# Patient Record
Sex: Female | Born: 1980
Health system: Southern US, Community
[De-identification: ages and names within clinical notes are randomized; demographics above are authoritative.]

## PROBLEM LIST (undated history)

## (undated) DIAGNOSIS — F32A Depression, unspecified: Secondary | ICD-10-CM

## (undated) DIAGNOSIS — I10 Essential (primary) hypertension: Secondary | ICD-10-CM

## (undated) DIAGNOSIS — F419 Anxiety disorder, unspecified: Secondary | ICD-10-CM

## (undated) DIAGNOSIS — Z114 Encounter for screening for human immunodeficiency virus [HIV]: Secondary | ICD-10-CM

## (undated) DIAGNOSIS — F329 Major depressive disorder, single episode, unspecified: Secondary | ICD-10-CM

## (undated) DIAGNOSIS — G5602 Carpal tunnel syndrome, left upper limb: Secondary | ICD-10-CM

## (undated) DIAGNOSIS — Z9189 Other specified personal risk factors, not elsewhere classified: Secondary | ICD-10-CM

## (undated) DIAGNOSIS — O09299 Supervision of pregnancy with other poor reproductive or obstetric history, unspecified trimester: Secondary | ICD-10-CM

## (undated) DIAGNOSIS — Z8619 Personal history of other infectious and parasitic diseases: Secondary | ICD-10-CM

## (undated) DIAGNOSIS — A630 Anogenital (venereal) warts: Secondary | ICD-10-CM

## (undated) HISTORY — DX: Other specified personal risk factors, not elsewhere classified: Z91.89

## (undated) HISTORY — DX: Encounter for screening for human immunodeficiency virus (HIV): Z11.4

## (undated) HISTORY — DX: Major depressive disorder, single episode, unspecified: F32.9

## (undated) HISTORY — DX: Depression, unspecified: F32.A

## (undated) HISTORY — DX: Anxiety disorder, unspecified: F41.9

## (undated) HISTORY — DX: Essential (primary) hypertension: I10

## (undated) HISTORY — DX: Anogenital (venereal) warts: A63.0

## (undated) HISTORY — DX: Personal history of other infectious and parasitic diseases: Z86.19

---
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
## 2015-10-12 DIAGNOSIS — Z3201 Encounter for pregnancy test, result positive: Secondary | ICD-10-CM | POA: Diagnosis not present

## 2015-11-02 DIAGNOSIS — Z3201 Encounter for pregnancy test, result positive: Secondary | ICD-10-CM | POA: Diagnosis not present

## 2015-11-13 DIAGNOSIS — Z113 Encounter for screening for infections with a predominantly sexual mode of transmission: Secondary | ICD-10-CM | POA: Diagnosis not present

## 2015-11-13 DIAGNOSIS — O09521 Supervision of elderly multigravida, first trimester: Secondary | ICD-10-CM | POA: Diagnosis not present

## 2015-11-13 DIAGNOSIS — Z36 Encounter for antenatal screening of mother: Secondary | ICD-10-CM | POA: Diagnosis not present

## 2015-11-13 LAB — OB RESULTS CONSOLE ABO/RH: RH TYPE: POSITIVE

## 2015-11-13 LAB — OB RESULTS CONSOLE RUBELLA ANTIBODY, IGM: Rubella: IMMUNE

## 2015-11-13 LAB — OB RESULTS CONSOLE HIV ANTIBODY (ROUTINE TESTING): HIV: NONREACTIVE

## 2015-11-13 LAB — OB RESULTS CONSOLE GC/CHLAMYDIA
CHLAMYDIA, DNA PROBE: NEGATIVE
Gonorrhea: NEGATIVE

## 2015-11-13 LAB — OB RESULTS CONSOLE ANTIBODY SCREEN: ANTIBODY SCREEN: NEGATIVE

## 2015-11-13 LAB — OB RESULTS CONSOLE RPR: RPR: NONREACTIVE

## 2015-11-13 LAB — OB RESULTS CONSOLE HEPATITIS B SURFACE ANTIGEN: Hepatitis B Surface Ag: NEGATIVE

## 2015-11-21 DIAGNOSIS — O09521 Supervision of elderly multigravida, first trimester: Secondary | ICD-10-CM | POA: Diagnosis not present

## 2015-11-21 DIAGNOSIS — Z3A10 10 weeks gestation of pregnancy: Secondary | ICD-10-CM | POA: Diagnosis not present

## 2015-12-28 DIAGNOSIS — Z36 Encounter for antenatal screening of mother: Secondary | ICD-10-CM | POA: Diagnosis not present

## 2016-01-15 DIAGNOSIS — O09522 Supervision of elderly multigravida, second trimester: Secondary | ICD-10-CM | POA: Diagnosis not present

## 2016-01-15 DIAGNOSIS — Z3A18 18 weeks gestation of pregnancy: Secondary | ICD-10-CM | POA: Diagnosis not present

## 2016-01-30 DIAGNOSIS — Z3A20 20 weeks gestation of pregnancy: Secondary | ICD-10-CM | POA: Diagnosis not present

## 2016-01-30 DIAGNOSIS — O09522 Supervision of elderly multigravida, second trimester: Secondary | ICD-10-CM | POA: Diagnosis not present

## 2016-03-24 DIAGNOSIS — Z23 Encounter for immunization: Secondary | ICD-10-CM | POA: Diagnosis not present

## 2016-03-24 DIAGNOSIS — G5602 Carpal tunnel syndrome, left upper limb: Secondary | ICD-10-CM | POA: Diagnosis not present

## 2016-03-28 DIAGNOSIS — Z3689 Encounter for other specified antenatal screening: Secondary | ICD-10-CM | POA: Diagnosis not present

## 2016-03-28 DIAGNOSIS — Z3A28 28 weeks gestation of pregnancy: Secondary | ICD-10-CM | POA: Diagnosis not present

## 2016-03-28 DIAGNOSIS — O09523 Supervision of elderly multigravida, third trimester: Secondary | ICD-10-CM | POA: Diagnosis not present

## 2016-03-28 DIAGNOSIS — Z23 Encounter for immunization: Secondary | ICD-10-CM | POA: Diagnosis not present

## 2016-03-28 LAB — OB RESULTS CONSOLE RPR: RPR: NONREACTIVE

## 2016-05-09 ENCOUNTER — Encounter (HOSPITAL_COMMUNITY): Payer: Self-pay | Admitting: *Deleted

## 2016-05-09 ENCOUNTER — Observation Stay (HOSPITAL_COMMUNITY): Payer: BLUE CROSS/BLUE SHIELD

## 2016-05-09 ENCOUNTER — Inpatient Hospital Stay (HOSPITAL_COMMUNITY)
Admission: AD | Admit: 2016-05-09 | Discharge: 2016-05-17 | DRG: 766 | Disposition: A | Discharge status: Home / Self Care | Payer: BLUE CROSS/BLUE SHIELD | Source: Ambulatory Visit | Attending: Obstetrics | Admitting: Obstetrics

## 2016-05-09 DIAGNOSIS — Z3A00 Weeks of gestation of pregnancy not specified: Secondary | ICD-10-CM | POA: Diagnosis not present

## 2016-05-09 DIAGNOSIS — O9962 Diseases of the digestive system complicating childbirth: Secondary | ICD-10-CM | POA: Diagnosis not present

## 2016-05-09 DIAGNOSIS — Z3A34 34 weeks gestation of pregnancy: Secondary | ICD-10-CM | POA: Diagnosis not present

## 2016-05-09 DIAGNOSIS — O133 Gestational [pregnancy-induced] hypertension without significant proteinuria, third trimester: Secondary | ICD-10-CM

## 2016-05-09 DIAGNOSIS — Z87891 Personal history of nicotine dependence: Secondary | ICD-10-CM

## 2016-05-09 DIAGNOSIS — Z23 Encounter for immunization: Secondary | ICD-10-CM | POA: Diagnosis not present

## 2016-05-09 DIAGNOSIS — O1494 Unspecified pre-eclampsia, complicating childbirth: Secondary | ICD-10-CM | POA: Diagnosis not present

## 2016-05-09 DIAGNOSIS — Z3A35 35 weeks gestation of pregnancy: Secondary | ICD-10-CM | POA: Diagnosis not present

## 2016-05-09 DIAGNOSIS — O165 Unspecified maternal hypertension, complicating the puerperium: Secondary | ICD-10-CM | POA: Diagnosis present

## 2016-05-09 DIAGNOSIS — O135 Gestational [pregnancy-induced] hypertension without significant proteinuria, complicating the puerperium: Secondary | ICD-10-CM | POA: Diagnosis not present

## 2016-05-09 DIAGNOSIS — Z3490 Encounter for supervision of normal pregnancy, unspecified, unspecified trimester: Secondary | ICD-10-CM

## 2016-05-09 DIAGNOSIS — O1414 Severe pre-eclampsia complicating childbirth: Secondary | ICD-10-CM | POA: Diagnosis not present

## 2016-05-09 DIAGNOSIS — K219 Gastro-esophageal reflux disease without esophagitis: Secondary | ICD-10-CM | POA: Diagnosis present

## 2016-05-09 DIAGNOSIS — O09523 Supervision of elderly multigravida, third trimester: Secondary | ICD-10-CM | POA: Diagnosis not present

## 2016-05-09 DIAGNOSIS — R030 Elevated blood-pressure reading, without diagnosis of hypertension: Secondary | ICD-10-CM | POA: Diagnosis not present

## 2016-05-09 DIAGNOSIS — O1493 Unspecified pre-eclampsia, third trimester: Secondary | ICD-10-CM | POA: Diagnosis not present

## 2016-05-09 DIAGNOSIS — O09513 Supervision of elderly primigravida, third trimester: Secondary | ICD-10-CM

## 2016-05-09 DIAGNOSIS — O99344 Other mental disorders complicating childbirth: Secondary | ICD-10-CM | POA: Diagnosis not present

## 2016-05-09 DIAGNOSIS — F418 Other specified anxiety disorders: Secondary | ICD-10-CM | POA: Diagnosis present

## 2016-05-09 DIAGNOSIS — O139 Gestational [pregnancy-induced] hypertension without significant proteinuria, unspecified trimester: Secondary | ICD-10-CM | POA: Diagnosis present

## 2016-05-09 LAB — COMPREHENSIVE METABOLIC PANEL
ALBUMIN: 2.9 g/dL — AB (ref 3.5–5.0)
ALT: 16 U/L (ref 14–54)
ANION GAP: 8 (ref 5–15)
AST: 23 U/L (ref 15–41)
Alkaline Phosphatase: 107 U/L (ref 38–126)
BILIRUBIN TOTAL: 0.3 mg/dL (ref 0.3–1.2)
BUN: 9 mg/dL (ref 6–20)
CO2: 21 mmol/L — AB (ref 22–32)
Calcium: 9 mg/dL (ref 8.9–10.3)
Chloride: 106 mmol/L (ref 101–111)
Creatinine, Ser: 0.54 mg/dL (ref 0.44–1.00)
GFR calc non Af Amer: >60 mL/min (ref >60)
GLUCOSE: 122 mg/dL — AB (ref 65–99)
POTASSIUM: 3.9 mmol/L (ref 3.5–5.1)
SODIUM: 135 mmol/L (ref 135–145)
TOTAL PROTEIN: 5.6 g/dL — AB (ref 6.5–8.1)

## 2016-05-09 LAB — GROUP B STREP BY PCR: Group B strep by PCR: NEGATIVE

## 2016-05-09 LAB — PROTEIN / CREATININE RATIO, URINE
Creatinine, Urine: 193 mg/dL
Protein Creatinine Ratio: 0.8 mg/mg{Cre} — ABNORMAL HIGH (ref 0.00–0.15)
TOTAL PROTEIN, URINE: 154 mg/dL

## 2016-05-09 LAB — ABO/RH: ABO/RH(D): O POS

## 2016-05-09 LAB — CBC
HCT: 37.2 % (ref 36.0–46.0)
Hemoglobin: 13.4 g/dL (ref 12.0–15.0)
MCH: 30.9 pg (ref 26.0–34.0)
MCHC: 36 g/dL (ref 30.0–36.0)
MCV: 85.7 fL (ref 78.0–100.0)
Platelets: 203 10*3/uL (ref 150–400)
RBC: 4.34 MIL/uL (ref 3.87–5.11)
RDW: 13.5 % (ref 11.5–15.5)
WBC: 10.8 10*3/uL — ABNORMAL HIGH (ref 4.0–10.5)

## 2016-05-09 LAB — URIC ACID: Uric Acid, Serum: 5.5 mg/dL (ref 2.3–6.6)

## 2016-05-09 LAB — TYPE AND SCREEN
ABO/RH(D): O POS
ANTIBODY SCREEN: NEGATIVE

## 2016-05-09 LAB — LACTATE DEHYDROGENASE: LDH: 143 U/L (ref 98–192)

## 2016-05-09 MED ORDER — LACTATED RINGERS IV SOLN
INTRAVENOUS | Status: DC
  Administered 2016-05-10 – 2016-05-13 (×5): via INTRAVENOUS

## 2016-05-09 MED ORDER — LABETALOL HCL 100 MG PO TABS
100.0000 mg | ORAL_TABLET | Freq: Three times a day (TID) | ORAL | Status: DC
  Administered 2016-05-09 – 2016-05-11 (×6): 100 mg via ORAL
  Filled 2016-05-09 (×6): qty 1

## 2016-05-09 MED ORDER — LABETALOL HCL 5 MG/ML IV SOLN
20.0000 mg | Freq: Once | INTRAVENOUS | Status: AC
  Administered 2016-05-09: 20 mg via INTRAVENOUS
  Filled 2016-05-09: qty 4

## 2016-05-09 MED ORDER — MAGNESIUM SULFATE 50 % IJ SOLN
2.0000 g/h | INTRAVENOUS | Status: AC
  Administered 2016-05-10 (×2): 2 g/h via INTRAVENOUS
  Filled 2016-05-09 (×2): qty 80

## 2016-05-09 MED ORDER — ZOLPIDEM TARTRATE 5 MG PO TABS: 5.0000 mg | ORAL_TABLET | Freq: Every evening | ORAL | Status: DC | PRN

## 2016-05-09 MED ORDER — MAGNESIUM SULFATE BOLUS VIA INFUSION
4.0000 g | Freq: Once | INTRAVENOUS | Status: AC
  Administered 2016-05-10: 4 g via INTRAVENOUS
  Filled 2016-05-09: qty 500

## 2016-05-09 MED ORDER — BETAMETHASONE SOD PHOS & ACET 6 (3-3) MG/ML IJ SUSP
12.0000 mg | INTRAMUSCULAR | Status: AC
  Administered 2016-05-09 – 2016-05-10 (×2): 12 mg via INTRAMUSCULAR
  Filled 2016-05-09 (×2): qty 2

## 2016-05-09 NOTE — Progress Notes (Signed)
Patient ID: [name], female   DOB: [birth year], 35 y.o.   MRN: [id number] Pt reports tremendous anxiety upon admission (husband out of town). Admitted for expectant management. Denies headache, visual changes, or epigastric pain. No chest pain or SOB. Good FM. No contractions or LOF.  BP (!) 163/90 (BP Location: Right Arm)   Pulse 84   Temp 98.1 F (36.7 C)   Resp 18   Ht 5' 2.5" (1.588 m)   Wt 78.5 kg (173 lb)   LMP 09/11/2015   BMI 31.14 kg/m     Lungs: CTA CV: RRR ABD: Gravid, NT, No RUQ tenderness EXT: DTRs 3+ without clonus Neuro: non focal  CBC    Component Value Date/Time   WBC 10.8 (H) 05/09/2016 1508   RBC 4.34 05/09/2016 1508   HGB 13.4 05/09/2016 1508   HCT 37.2 05/09/2016 1508   PLT 203 05/09/2016 1508   MCV 85.7 05/09/2016 1508   MCH 30.9 05/09/2016 1508   MCHC 36.0 05/09/2016 1508   RDW 13.5 05/09/2016 1508   CMP     Component Value Date/Time   NA 135 05/09/2016 1508   K 3.9 05/09/2016 1508   CL 106 05/09/2016 1508   CO2 21 (L) 05/09/2016 1508   GLUCOSE 122 (H) 05/09/2016 1508   BUN 9 05/09/2016 1508   CREATININE 0.54 05/09/2016 1508   CALCIUM 9.0 05/09/2016 1508   PROT 5.6 (L) 05/09/2016 1508   ALBUMIN 2.9 (L) 05/09/2016 1508   AST 23 05/09/2016 1508   ALT 16 05/09/2016 1508   ALKPHOS 107 05/09/2016 1508   BILITOT 0.3 05/09/2016 1508   GFRNONAA >60 05/09/2016 1508   GFRAA >60 05/09/2016 1508    FHR reactive. Sono results pending.  34 week IUP PEC- stable labs. Severe range BP with no maternal symptoms, stable labs and reassuring fetal status  Anti hypertensive treatment initiated Magnesium sulfate for seizure prophylaxis Serial labs

## 2016-05-09 NOTE — H&P (Signed)
Kara Murphy is a 35 y.o. G1P0 at 35'3 by LMP c/w 7 wk u/s presenting for further evaluation of elevated bp. Pt notes no contractions. Good fetal movement, No vaginal bleeding, not leaking fluid. Pt seen for routine OB visit and noted to have bp 132/98, 138/98, 146/98. Pt notes no HA, occasional scotomata over the past wk and increase in GERD. She notes dyspnea on exertion but no true SOB or CP. Pt does note increase in swelling. Starting at 28 wks, pt started to have dbp in 80's but no prior h/o htn.   [facility] at [facility] since 7 wks - Dated by LMP c/w 7 wk u/s - AMA. Nl INformaseq - Anxiety/ depression, sx well controlled on Fluoxetine - b/l carpal tunnel- uses wrist splints -   Prenatal Transfer Tool  Maternal Diabetes: No Genetic Screening: Normal Maternal Ultrasounds/Referrals: Normal Fetal Ultrasounds or other Referrals:  None Maternal Substance Abuse:  No Significant Maternal Medications:  None Significant Maternal Lab Results: None     OB History    Gravida Para Term Preterm AB Living   1             SAB TAB Ectopic Multiple Live Births                 No past medical history on file. No past surgical history on file. Family History: family history is not on file. Social History:  reports that she has quit smoking. She has quit using smokeless tobacco. Her alcohol and drug histories are not on file.  Review of Systems - Negative except swelling     Blood pressure (!) 176/105, pulse 82, temperature 98.2 F (36.8 C), resp. rate 18, height 5' 2.5" (1.588 m), weight 78.5 kg (173 lb), last menstrual period 09/11/2015.  Vitals:   05/09/16 1637 05/09/16 1714 05/09/16 1748 05/09/16 1815  BP: (!) 179/104 (!) 176/105 (!) 161/105 (!) 167/120  Pulse: 87 82 78 86  Resp: 18   18  Temp:      Weight:      Height:        Physical Exam:  Gen: well appearing, no distress CV: RRR Pulm: CTAB Back: no CVAT Abd: gravid, NT, no RUQ pain LE: 2+ edema, equal  bilaterally, non-tender, 3+ DTR, no clonis Toco: irreg ctx FH: baseline 130s, accelerations present, no deceleratons, 10 beat variability  Prenatal labs: ABO, Rh: --/--/O POS (11/17 1508) Antibody: NEG (11/17 1508) Rubella: !Error! immune RPR:   NR HBsAg:   neg HIV:   neg GBS:   not yet done 1 hr Glucola 108  Genetic screening nl Informaseq, nl AFP Anatomy US normal   Assessment/Plan: 35 y.o. G1P0 at 34'3 with new increase in bp in office with new proteinuria for admission to evaluate for PEC vs gestational hypertension, fetal evaluation and BMZ.  - fetal eval. NST/ toco q shift and prn, growth u/s with MFM.  - BMZ - htn. On admit bp's higher than in office, unclear if this is due to anxiety or true disease. Pr/Cr ration suggestive of PEC. If continued elevated bp's would consider dx of severe PEC and move to delivery as pt >34 wks. Consider Magnesium for severe PEC. Current elevations may be due to pt's anxiety so would consider short period of expectant management prior to decision for delivery. Labs stable.  - Care of pt transferred to Dr. Cheree Ditto A. 05/09/2016, 6:08 PM

## 2016-05-10 DIAGNOSIS — Z3A34 34 weeks gestation of pregnancy: Secondary | ICD-10-CM | POA: Diagnosis not present

## 2016-05-10 DIAGNOSIS — O1493 Unspecified pre-eclampsia, third trimester: Secondary | ICD-10-CM | POA: Diagnosis not present

## 2016-05-10 LAB — COMPREHENSIVE METABOLIC PANEL
ALBUMIN: 2.8 g/dL — AB (ref 3.5–5.0)
ALT: 17 U/L (ref 14–54)
ANION GAP: 10 (ref 5–15)
AST: 23 U/L (ref 15–41)
Alkaline Phosphatase: 98 U/L (ref 38–126)
BILIRUBIN TOTAL: 0.6 mg/dL (ref 0.3–1.2)
BUN: 10 mg/dL (ref 6–20)
CO2: 18 mmol/L — ABNORMAL LOW (ref 22–32)
Calcium: 8.1 mg/dL — ABNORMAL LOW (ref 8.9–10.3)
Chloride: 106 mmol/L (ref 101–111)
Creatinine, Ser: 0.41 mg/dL — ABNORMAL LOW (ref 0.44–1.00)
Glucose, Bld: 130 mg/dL — ABNORMAL HIGH (ref 65–99)
POTASSIUM: 3.7 mmol/L (ref 3.5–5.1)
Sodium: 134 mmol/L — ABNORMAL LOW (ref 135–145)
TOTAL PROTEIN: 5.8 g/dL — AB (ref 6.5–8.1)

## 2016-05-10 LAB — CBC
HEMATOCRIT: 34 % — AB (ref 36.0–46.0)
Hemoglobin: 12.4 g/dL (ref 12.0–15.0)
MCH: 31.2 pg (ref 26.0–34.0)
MCHC: 36.5 g/dL — AB (ref 30.0–36.0)
MCV: 85.6 fL (ref 78.0–100.0)
Platelets: 209 10*3/uL (ref 150–400)
RBC: 3.97 MIL/uL (ref 3.87–5.11)
RDW: 13.6 % (ref 11.5–15.5)
WBC: 15.9 10*3/uL — ABNORMAL HIGH (ref 4.0–10.5)

## 2016-05-10 LAB — CULTURE, OB URINE

## 2016-05-10 LAB — CREATININE CLEARANCE, URINE, 24 HOUR
COLLECTION INTERVAL-CRCL: 24 h
CREAT CLEAR: 221 mL/min — AB (ref 75–115)
Creatinine, 24H Ur: 1306 mg/d (ref 600–1800)
Creatinine, Urine: 68.75 mg/dL
Urine Total Volume-CRCL: 1900 mL

## 2016-05-10 LAB — PROTEIN, URINE, 24 HOUR
COLLECTION INTERVAL-UPROT: 24 h
Protein, 24H Urine: 1216 mg/d — ABNORMAL HIGH (ref 50–100)
Protein, Urine: 64 mg/dL
Urine Total Volume-UPROT: 1900 mL

## 2016-05-10 LAB — URIC ACID: URIC ACID, SERUM: 5.4 mg/dL (ref 2.3–6.6)

## 2016-05-10 LAB — LACTATE DEHYDROGENASE: LDH: 122 U/L (ref 98–192)

## 2016-05-10 MED ORDER — ACETAMINOPHEN 325 MG PO TABS
650.0000 mg | ORAL_TABLET | Freq: Four times a day (QID) | ORAL | Status: DC | PRN
  Administered 2016-05-10: 650 mg via ORAL
  Filled 2016-05-10: qty 2

## 2016-05-10 MED ORDER — CALCIUM CARBONATE ANTACID 500 MG PO CHEW
400.0000 mg | CHEWABLE_TABLET | Freq: Four times a day (QID) | ORAL | Status: DC | PRN
  Administered 2016-05-10 – 2016-05-11 (×3): 400 mg via ORAL
  Filled 2016-05-10 (×3): qty 2

## 2016-05-10 MED ORDER — PRENATAL MULTIVITAMIN CH
1.0000 | ORAL_TABLET | Freq: Every day | ORAL | Status: DC
  Administered 2016-05-11 – 2016-05-12 (×2): 1 via ORAL
  Filled 2016-05-10 (×3): qty 1

## 2016-05-10 MED ORDER — FLUOXETINE HCL 20 MG PO CAPS
20.0000 mg | ORAL_CAPSULE | Freq: Every day | ORAL | Status: DC
  Administered 2016-05-10: 20 mg via ORAL
  Filled 2016-05-10 (×2): qty 1

## 2016-05-10 NOTE — Progress Notes (Signed)
Patient ID: Kara Murphy, female   DOB: Oct 07, 1980, 35 y.o.   MRN: [id number] Denies occipital HAs Has frontal HA since Mg04 No N/V or epigastric pain. No visual changes. Good FM BP (!) 145/88 (BP Location: Right Arm)   Pulse 84   Temp 97.7 F (36.5 C) (Oral)   Resp 17   Ht 5' 2.5" (1.588 m)   Wt 78.5 kg (173 lb)   LMP 09/11/2015   SpO2 98%   BMI 31.14 kg/m   Stable PEC- BPs improved. Labs stable. Asymtomatic. Fetal status reassuring BMZ #2 given Continue expectant management. Will dc MgS04 tomorrow

## 2016-05-10 NOTE — Progress Notes (Signed)
Patient ID: Kara Murphy, female   DOB: [birth year], 35 y.o.   MRN: [id number] HD #1 34w 4d PEC on MgSO4  S: No headaches, visual changes or epigastric pain. No CP, No SOB. Good FM. No LOF.  O: Patient Vitals for the past 24 hrs:  BP Temp Temp src Pulse Resp SpO2 Height Weight  05/10/16 1200 139/85 97.5 F (36.4 C) Oral 91 16 - - -  05/10/16 0800 126/80 97.7 F (36.5 C) Oral 97 16 99 % - -  05/10/16 0701 - - - 99 - 99 % - -  05/10/16 0700 135/84 - - - 18 - - -  05/10/16 0600 130/88 97.9 F (36.6 C) Oral 88 20 - - -  05/10/16 0501 137/86 - - 89 20 97 % - -  05/10/16 0401 (!) 142/89 97.6 F (36.4 C) Axillary 91 18 98 % - -  05/10/16 0300 (!) 149/94 - - 88 18 97 % - -  05/10/16 0200 132/77 - - 85 18 99 % - -  05/10/16 0101 (!) 146/94 - - 93 20 98 % - -  05/10/16 0020 (!) 148/95 - - 99 18 98 % - -  05/10/16 0010 (!) 156/92 - - 95 20 97 % - -  05/10/16 0000 (!) 150/92 98 F (36.7 C) Oral 86 20 98 % - -  05/09/16 2100 (!) 163/90 - - 84 18 - - -  05/09/16 2037 (!) 164/94 - - 82 - - - -  05/09/16 2016 (!) 158/97 98.1 F (36.7 C) - 89 18 - - -  05/09/16 1852 (!) 146/93 - - 82 18 - - -  05/09/16 1842 (!) 155/95 - - 79 18 - - -  05/09/16 1839 (!) 174/95 - - 91 18 - - -  05/09/16 1815 (!) 167/120 - - 86 18 - - -  05/09/16 1748 (!) 161/105 - - 78 - - - -  05/09/16 1714 (!) 176/105 - - 82 - - - -  05/09/16 1637 (!) 179/104 - - 87 18 - - -  05/09/16 1546 (!) 169/96 - - 78 - - - -  05/09/16 1539 (!) 170/106 - - 80 18 - - -  05/09/16 1526 - - - - - - 5' 2.5" (1.588 m) 78.5 kg (173 lb)  05/09/16 1507 (!) 157/104 98.2 F (36.8 C) - 86 18 - - -   NCAT Neck supple with FROM Lungs: CTA CV: RRR ABD: gravid, NT No CVAT EXT: DTRs 3+, no clonus Neuro: nonfocal Skin : intact CBC    Component Value Date/Time   WBC 15.9 (H) 05/10/2016 0511   RBC 3.97 05/10/2016 0511   HGB 12.4 05/10/2016 0511   HCT 34.0 (L) 05/10/2016 0511   PLT 209 05/10/2016 0511   MCV 85.6 05/10/2016 0511   MCH  31.2 05/10/2016 0511   MCHC 36.5 (H) 05/10/2016 0511   RDW 13.6 05/10/2016 0511   CMP     Component Value Date/Time   NA 134 (L) 05/10/2016 0511   K 3.7 05/10/2016 0511   CL 106 05/10/2016 0511   CO2 18 (L) 05/10/2016 0511   GLUCOSE 130 (H) 05/10/2016 0511   BUN 10 05/10/2016 0511   CREATININE 0.41 (L) 05/10/2016 0511   CALCIUM 8.1 (L) 05/10/2016 0511   PROT 5.8 (L) 05/10/2016 0511   ALBUMIN 2.8 (L) 05/10/2016 0511   AST 23 05/10/2016 0511   ALT 17 05/10/2016 0511   ALKPHOS 98 05/10/2016 0511   BILITOT  0.6 05/10/2016 0511   GFRNONAA >60 05/10/2016 0511   GFRAA >60 05/10/2016 0511  Sono c/w aGA, cephalic, nl afi.  IMP: 34 weeks 4d IUP PEC - stable BP on MgSo4. Stable labs. Reassuring fetal status.  Plan: MgSO4 today and dc in am BMZ #2 today 24 UTP in process Monitor s/s PEC

## 2016-05-11 DIAGNOSIS — F418 Other specified anxiety disorders: Secondary | ICD-10-CM | POA: Diagnosis present

## 2016-05-11 DIAGNOSIS — Z3A35 35 weeks gestation of pregnancy: Secondary | ICD-10-CM | POA: Diagnosis not present

## 2016-05-11 DIAGNOSIS — Z87891 Personal history of nicotine dependence: Secondary | ICD-10-CM | POA: Diagnosis not present

## 2016-05-11 DIAGNOSIS — O1494 Unspecified pre-eclampsia, complicating childbirth: Secondary | ICD-10-CM | POA: Diagnosis not present

## 2016-05-11 DIAGNOSIS — R030 Elevated blood-pressure reading, without diagnosis of hypertension: Secondary | ICD-10-CM | POA: Diagnosis present

## 2016-05-11 DIAGNOSIS — O165 Unspecified maternal hypertension, complicating the puerperium: Secondary | ICD-10-CM | POA: Diagnosis present

## 2016-05-11 DIAGNOSIS — O1414 Severe pre-eclampsia complicating childbirth: Secondary | ICD-10-CM | POA: Diagnosis not present

## 2016-05-11 DIAGNOSIS — O9962 Diseases of the digestive system complicating childbirth: Secondary | ICD-10-CM | POA: Diagnosis present

## 2016-05-11 DIAGNOSIS — O1493 Unspecified pre-eclampsia, third trimester: Secondary | ICD-10-CM | POA: Diagnosis not present

## 2016-05-11 DIAGNOSIS — K219 Gastro-esophageal reflux disease without esophagitis: Secondary | ICD-10-CM | POA: Diagnosis present

## 2016-05-11 DIAGNOSIS — Z3A34 34 weeks gestation of pregnancy: Secondary | ICD-10-CM | POA: Diagnosis not present

## 2016-05-11 DIAGNOSIS — Z3A00 Weeks of gestation of pregnancy not specified: Secondary | ICD-10-CM | POA: Diagnosis not present

## 2016-05-11 DIAGNOSIS — O99344 Other mental disorders complicating childbirth: Secondary | ICD-10-CM | POA: Diagnosis present

## 2016-05-11 DIAGNOSIS — O139 Gestational [pregnancy-induced] hypertension without significant proteinuria, unspecified trimester: Secondary | ICD-10-CM | POA: Diagnosis present

## 2016-05-11 MED ORDER — LABETALOL HCL 100 MG PO TABS
100.0000 mg | ORAL_TABLET | Freq: Three times a day (TID) | ORAL | Status: DC
  Administered 2016-05-11: 100 mg via ORAL
  Filled 2016-05-11: qty 1

## 2016-05-11 MED ORDER — FLUOXETINE HCL 20 MG PO CAPS
30.0000 mg | ORAL_CAPSULE | Freq: Every day | ORAL | Status: DC
  Administered 2016-05-11 – 2016-05-12 (×2): 30 mg via ORAL
  Filled 2016-05-11 (×3): qty 1

## 2016-05-11 MED ORDER — LABETALOL HCL 100 MG PO TABS
200.0000 mg | ORAL_TABLET | Freq: Three times a day (TID) | ORAL | Status: DC
  Administered 2016-05-11 – 2016-05-12 (×2): 200 mg via ORAL
  Filled 2016-05-11 (×2): qty 2

## 2016-05-11 NOTE — Progress Notes (Signed)
Patient ID: Kara Murphy, female   DOB: 1980/09/30, 35 y.o.   MRN: [id number] Pt with episode of SOB Reports increased anxiety  BP (!) 151/80 (BP Location: Right Arm)   Pulse 83   Temp 98 F (36.7 C) (Oral)   Resp 18   Ht 5' 2.5" (1.588 m)   Wt 78.5 kg (173 lb)   LMP 09/11/2015   SpO2 94%   BMI 31.14 kg/m    Lungs CTA CV RRR  Imp: PEC-stable SOB- c/w inc anxiety Recommend inc Prozac to 30 mg May need prn Buspar

## 2016-05-11 NOTE — Progress Notes (Signed)
Patient ID: Kara Murphy, female   DOB: 08-13-1980, 35 y.o.   MRN: [id number] HD #2 34w 5d PEC on MgSO4  S: No headaches tpday. No visual changes or epigastric pain. No CP, No SOB. Good FM. No LOF.  O: Patient Vitals for the past 24 hrs:  BP Temp Temp src Pulse Resp SpO2  05/11/16 1127 126/73 98.1 F (36.7 C) Oral 86 14 -  05/11/16 0823 (!) 146/92 97.6 F (36.4 C) Oral 87 16 97 %  05/11/16 0620 135/83 98 F (36.7 C) Oral 86 16 95 %  05/10/16 2230 (!) 147/84 - - 86 20 99 %  05/10/16 1941 (!) 145/88 97.7 F (36.5 C) Oral 84 17 -  05/10/16 1536 (!) 146/89 98.1 F (36.7 C) Oral 91 16 98 %   NCAT Neck supple with FROM Lungs: CTA CV: RRR ABD: gravid, NT No CVAT EXT: DTRs 3+, no clonus Neuro: nonfocal Skin : intact CBC    Component Value Date/Time   WBC 15.9 (H) 05/10/2016 0511   RBC 3.97 05/10/2016 0511   HGB 12.4 05/10/2016 0511   HCT 34.0 (L) 05/10/2016 0511   PLT 209 05/10/2016 0511   MCV 85.6 05/10/2016 0511   MCH 31.2 05/10/2016 0511   MCHC 36.5 (H) 05/10/2016 0511   RDW 13.6 05/10/2016 0511   CMP     Component Value Date/Time   NA 134 (L) 05/10/2016 0511   K 3.7 05/10/2016 0511   CL 106 05/10/2016 0511   CO2 18 (L) 05/10/2016 0511   GLUCOSE 130 (H) 05/10/2016 0511   BUN 10 05/10/2016 0511   CREATININE 0.41 (L) 05/10/2016 0511   CALCIUM 8.1 (L) 05/10/2016 0511   PROT 5.8 (L) 05/10/2016 0511   ALBUMIN 2.8 (L) 05/10/2016 0511   AST 23 05/10/2016 0511   ALT 17 05/10/2016 0511   ALKPHOS 98 05/10/2016 0511   BILITOT 0.6 05/10/2016 0511   GFRNONAA >60 05/10/2016 0511   GFRAA >60 05/10/2016 0511  Sono c/w aGA, cephalic, nl afi. 24UTP confirms PEC- 1216mg   IMP: 34 weeks 5d IUP PEC - stable BP on MgSo4. Stable labs. Reassuring fetal status.  Plan: MgSO4 off BMZ completed Continue Labetalol 24 UTP noted NST tid Labs in am Monitor s/s PEC

## 2016-05-12 LAB — CBC
HCT: 34.7 % — ABNORMAL LOW (ref 36.0–46.0)
Hemoglobin: 12.1 g/dL (ref 12.0–15.0)
MCH: 30.7 pg (ref 26.0–34.0)
MCHC: 34.9 g/dL (ref 30.0–36.0)
MCV: 88.1 fL (ref 78.0–100.0)
Platelets: 222 10*3/uL (ref 150–400)
RBC: 3.94 MIL/uL (ref 3.87–5.11)
RDW: 14.2 % (ref 11.5–15.5)
WBC: 17 10*3/uL — ABNORMAL HIGH (ref 4.0–10.5)

## 2016-05-12 LAB — COMPREHENSIVE METABOLIC PANEL
ALBUMIN: 2.6 g/dL — AB (ref 3.5–5.0)
ALT: 19 U/L (ref 14–54)
ANION GAP: 9 (ref 5–15)
AST: 28 U/L (ref 15–41)
Alkaline Phosphatase: 105 U/L (ref 38–126)
BUN: 13 mg/dL (ref 6–20)
CHLORIDE: 108 mmol/L (ref 101–111)
CO2: 20 mmol/L — AB (ref 22–32)
Calcium: 8.5 mg/dL — ABNORMAL LOW (ref 8.9–10.3)
Creatinine, Ser: 0.69 mg/dL (ref 0.44–1.00)
GFR calc Af Amer: >60 mL/min (ref >60)
GFR calc non Af Amer: >60 mL/min (ref >60)
GLUCOSE: 99 mg/dL (ref 65–99)
POTASSIUM: 4 mmol/L (ref 3.5–5.1)
SODIUM: 137 mmol/L (ref 135–145)
TOTAL PROTEIN: 5.4 g/dL — AB (ref 6.5–8.1)
Total Bilirubin: 0.5 mg/dL (ref 0.3–1.2)

## 2016-05-12 LAB — TYPE AND SCREEN
ABO/RH(D): O POS
Antibody Screen: NEGATIVE

## 2016-05-12 LAB — LACTATE DEHYDROGENASE: LDH: 191 U/L (ref 98–192)

## 2016-05-12 MED ORDER — BUSPIRONE HCL 5 MG PO TABS
5.0000 mg | ORAL_TABLET | Freq: Three times a day (TID) | ORAL | Status: DC
  Administered 2016-05-12 – 2016-05-17 (×14): 5 mg via ORAL
  Filled 2016-05-12 (×21): qty 1

## 2016-05-12 MED ORDER — LABETALOL HCL 200 MG PO TABS
300.0000 mg | ORAL_TABLET | Freq: Three times a day (TID) | ORAL | Status: DC
  Administered 2016-05-12 – 2016-05-13 (×4): 300 mg via ORAL
  Filled 2016-05-12 (×4): qty 1

## 2016-05-12 MED ORDER — SODIUM CHLORIDE 0.9% FLUSH
3.0000 mL | Freq: Two times a day (BID) | INTRAVENOUS | Status: DC
  Administered 2016-05-12: 3 mL via INTRAVENOUS

## 2016-05-12 MED ORDER — BUSPIRONE HCL 5 MG PO TABS
5.0000 mg | ORAL_TABLET | Freq: Two times a day (BID) | ORAL | Status: DC
  Administered 2016-05-12: 5 mg via ORAL
  Filled 2016-05-12 (×3): qty 1

## 2016-05-12 NOTE — Progress Notes (Signed)
Spoke w MFM on call Dr Lucia Gaskins.  Reviewed patient.  Recommend pt stay in house and plan delivery at 36 weeks. Recommend delivery sooner if pt requires IV antihypertensives to control BP or becomes symptomatic from preeclampsia.  Plan reviewed with patient, understands and agrees.   Plan PIH labs 2x/ wk, done today, repeat Thurs - 11/23         Daily weight        Space out monitoring again to 1 hr every 8 hrs.

## 2016-05-12 NOTE — Progress Notes (Addendum)
Patient asymptomatic with bp 170s/80s. Was advised by Jaaron Oleson not to call if pt asymptomatic.Pt notes marked anxiety and completely asymptomatic. Denies headaches, visual changes or epigastric pain. Repeat BP noted.

## 2016-05-12 NOTE — Progress Notes (Signed)
Patient refused vital signs checked at this time.  " I am having a panic attack and I really don't want to have a high reading.. Please come back later."  0230  Went back to patient room to check vital signs.  Patient still refused claiming, " I just threw up and am feeling calmer.  Give me about 20 min ."

## 2016-05-12 NOTE — Progress Notes (Signed)
Pt called out stating she is having a panic attack. Pt reassured and relaxation techniques discussed. Will continue to monitor.

## 2016-05-12 NOTE — Progress Notes (Signed)
35 yo G1, 34.6 wks, admission for severe range BP with preeclampsia.  S/p BTMZ 11/17, 18.  Severe Anxiety   Subjective: Anxiety all night from pressure under her ribs, not able to breath due to chest pressure and bloating from constipation. No HAs, no blurred vision, no abdo /RUQ pain. No swelling.  She was able to sleep some in the recliner and feels she is better this morning. She is on Prozac 30mg  and I have added Buspar this morning and she accepts it.   Objective: Vital signs in last 24 hours: Temp:  [98 F (36.7 C)-98.9 F (37.2 C)] 98.9 F (37.2 C) (11/20 0807) Pulse Rate:  [72-86] 85 (11/20 0917) Resp:  [14-20] 18 (11/20 0917) BP: (126-174)/(73-92) 151/92 (11/20 0917) SpO2:  [93 %-97 %] 96 % (11/20 [id number]) Weight change:     Intake/Output from previous day: 11/19 0701 - 11/20 0700 In: 1215 [P.O.:840; I.V.:375] Out: 1600 [Urine:1600]  General appearance: alert and cooperative Head: atraumatic, neg Resp: clear to auscultation bilaterally Cardio: regular rate and rhythm, S1, S2 normal, no murmur, click, rub or gallop Extremities: extremities normal, atraumatic, no cyanosis or edema DTR +2  Lab Results: CBC Latest Ref Rng & Units 05/12/2016 05/10/2016 05/09/2016  WBC 4.0 - 10.5 K/uL 17.0(H) 15.9(H) 10.8(H)  Hemoglobin 12.0 - 15.0 g/dL 12.1 12.4 13.4  Hematocrit 36.0 - 46.0 % 34.7(L) 34.0(L) 37.2  Platelets 150 - 400 K/uL 222 209 203   CMP Latest Ref Rng & Units 05/12/2016 05/10/2016 05/09/2016  Glucose 65 - 99 mg/dL 99 130(H) 122(H)  BUN 6 - 20 mg/dL 13 10 9   Creatinine 0.44 - 1.00 mg/dL 0.69 0.41(L) 0.54  Sodium 135 - 145 mmol/L 137 134(L) 135  Potassium 3.5 - 5.1 mmol/L 4.0 3.7 3.9  Chloride 101 - 111 mmol/L 108 106 106  CO2 22 - 32 mmol/L 20(L) 18(L) 21(L)  Calcium 8.9 - 10.3 mg/dL 8.5(L) 8.1(L) 9.0  Total Protein 6.5 - 8.1 g/dL 5.4(L) 5.8(L) 5.6(L)  Total Bilirubin 0.3 - 1.2 mg/dL 0.5 0.6 0.3  Alkaline Phos 38 - 126 U/L 105 98 107  AST 15 - 41 U/L 28 23 23    ALT 14 - 54 U/L 19 17 16     Medications: I have reviewed the patient's current medications. Labetalol 200mg  q8hrs--> increase to 300mg  q8 hrs today Prozac 20mg  --> Increased to 30 mg 11/19 Buspar Added today PNVit Colace  Assessment/Plan: 35 yo G1 at 34.6 wks, with Preeclampsia with systolic BPs in severe range but vary depending on her anxiety. Overall very stable on exam and labs.  24 hr urine ongoing Changing Labetalol dose, adding Buspar for anxiety. Will discuss NICU consult but that may or may not help her anxiety Delivery decision - maternal status v/s fetal prematurity reviewed. Will discuss w MFM this specially since normal diastolic BPs and systolic up when anxious.  FHT continuous now, will change after evaluation with MFM  Zenab Gronewold R 05/12/2016, 10:15 AM

## 2016-05-13 ENCOUNTER — Inpatient Hospital Stay (HOSPITAL_COMMUNITY): Payer: BLUE CROSS/BLUE SHIELD | Admitting: Anesthesiology

## 2016-05-13 ENCOUNTER — Encounter (HOSPITAL_COMMUNITY): Payer: Self-pay | Admitting: Obstetrics

## 2016-05-13 ENCOUNTER — Encounter (HOSPITAL_COMMUNITY)
Admission: AD | Disposition: A | Discharge status: Home / Self Care | Payer: Self-pay | Source: Ambulatory Visit | Attending: Obstetrics

## 2016-05-13 LAB — CBC
HCT: 35.9 % — ABNORMAL LOW (ref 36.0–46.0)
HEMOGLOBIN: 12.7 g/dL (ref 12.0–15.0)
MCH: 30.8 pg (ref 26.0–34.0)
MCHC: 35.4 g/dL (ref 30.0–36.0)
MCV: 87.1 fL (ref 78.0–100.0)
PLATELETS: 221 10*3/uL (ref 150–400)
RBC: 4.12 MIL/uL (ref 3.87–5.11)
RDW: 13.7 % (ref 11.5–15.5)
WBC: 14.9 10*3/uL — ABNORMAL HIGH (ref 4.0–10.5)

## 2016-05-13 LAB — COMPREHENSIVE METABOLIC PANEL
ALBUMIN: 2.6 g/dL — AB (ref 3.5–5.0)
ALK PHOS: 99 U/L (ref 38–126)
ALT: 21 U/L (ref 14–54)
ANION GAP: 9 (ref 5–15)
AST: 30 U/L (ref 15–41)
BUN: 11 mg/dL (ref 6–20)
CHLORIDE: 107 mmol/L (ref 101–111)
CO2: 19 mmol/L — AB (ref 22–32)
Calcium: 8.2 mg/dL — ABNORMAL LOW (ref 8.9–10.3)
Creatinine, Ser: 0.51 mg/dL (ref 0.44–1.00)
GFR calc non Af Amer: >60 mL/min (ref >60)
GLUCOSE: 82 mg/dL (ref 65–99)
Potassium: 3.8 mmol/L (ref 3.5–5.1)
SODIUM: 135 mmol/L (ref 135–145)
Total Bilirubin: 0.6 mg/dL (ref 0.3–1.2)
Total Protein: 5.6 g/dL — ABNORMAL LOW (ref 6.5–8.1)

## 2016-05-13 SURGERY — Surgical Case
Anesthesia: Spinal

## 2016-05-13 MED ORDER — NALBUPHINE HCL 10 MG/ML IJ SOLN: 5.0000 mg | Freq: Once | INTRAMUSCULAR | Status: DC | PRN

## 2016-05-13 MED ORDER — CEFAZOLIN SODIUM-DEXTROSE 2-3 GM-% IV SOLR
INTRAVENOUS | Status: DC | PRN
  Administered 2016-05-13: 2 g via INTRAVENOUS

## 2016-05-13 MED ORDER — SOD CITRATE-CITRIC ACID 500-334 MG/5ML PO SOLN
ORAL | Status: AC
  Administered 2016-05-13: 30 mL
  Filled 2016-05-13: qty 15

## 2016-05-13 MED ORDER — TETANUS-DIPHTH-ACELL PERTUSSIS 5-2.5-18.5 LF-MCG/0.5 IM SUSP: 0.5000 mL | Freq: Once | INTRAMUSCULAR | Status: DC

## 2016-05-13 MED ORDER — LABETALOL HCL 5 MG/ML IV SOLN
INTRAVENOUS | Status: AC
  Administered 2016-05-13: 20 mg via INTRAVENOUS
  Filled 2016-05-13: qty 4

## 2016-05-13 MED ORDER — DIPHENHYDRAMINE HCL 50 MG/ML IJ SOLN: 12.5000 mg | INTRAMUSCULAR | Status: DC | PRN

## 2016-05-13 MED ORDER — COCONUT OIL OIL
1.0000 "application " | TOPICAL_OIL | Status: DC | PRN
  Administered 2016-05-14: 1 via TOPICAL
  Filled 2016-05-13: qty 120

## 2016-05-13 MED ORDER — MEPERIDINE HCL 25 MG/ML IJ SOLN: 6.2500 mg | INTRAMUSCULAR | Status: DC | PRN

## 2016-05-13 MED ORDER — SENNOSIDES-DOCUSATE SODIUM 8.6-50 MG PO TABS
2.0000 | ORAL_TABLET | ORAL | Status: DC
  Administered 2016-05-14 (×2): 2 via ORAL
  Filled 2016-05-13 (×2): qty 2

## 2016-05-13 MED ORDER — NALOXONE HCL 2 MG/2ML IJ SOSY: 1.0000 ug/kg/h | PREFILLED_SYRINGE | INTRAVENOUS | Status: DC | PRN

## 2016-05-13 MED ORDER — LABETALOL HCL 5 MG/ML IV SOLN
20.0000 mg | Freq: Once | INTRAVENOUS | Status: AC
  Administered 2016-05-13: 20 mg via INTRAVENOUS

## 2016-05-13 MED ORDER — PHENYLEPHRINE 8 MG IN D5W 100 ML (0.08MG/ML) PREMIX OPTIME: INJECTION | INTRAVENOUS | Status: DC | PRN

## 2016-05-13 MED ORDER — KETOROLAC TROMETHAMINE 30 MG/ML IJ SOLN: 30.0000 mg | Freq: Four times a day (QID) | INTRAMUSCULAR | Status: AC | PRN

## 2016-05-13 MED ORDER — DIPHENHYDRAMINE HCL 25 MG PO CAPS: 25.0000 mg | ORAL_CAPSULE | ORAL | Status: DC | PRN

## 2016-05-13 MED ORDER — HYDRALAZINE HCL 20 MG/ML IJ SOLN: 10.0000 mg | Freq: Once | INTRAMUSCULAR | Status: DC | PRN

## 2016-05-13 MED ORDER — FENTANYL CITRATE (PF) 100 MCG/2ML IJ SOLN
INTRAMUSCULAR | Status: AC
  Filled 2016-05-13: qty 2

## 2016-05-13 MED ORDER — WITCH HAZEL-GLYCERIN EX PADS: 1.0000 "application " | MEDICATED_PAD | CUTANEOUS | Status: DC | PRN

## 2016-05-13 MED ORDER — MORPHINE SULFATE-NACL 0.5-0.9 MG/ML-% IV SOSY
PREFILLED_SYRINGE | INTRAVENOUS | Status: AC
  Filled 2016-05-13: qty 1

## 2016-05-13 MED ORDER — KETOROLAC TROMETHAMINE 30 MG/ML IJ SOLN: 30.0000 mg | Freq: Four times a day (QID) | INTRAMUSCULAR | Status: DC | PRN

## 2016-05-13 MED ORDER — SCOPOLAMINE 1 MG/3DAYS TD PT72
MEDICATED_PATCH | TRANSDERMAL | Status: AC
  Filled 2016-05-13: qty 1

## 2016-05-13 MED ORDER — LABETALOL HCL 5 MG/ML IV SOLN
20.0000 mg | INTRAVENOUS | Status: AC | PRN
  Administered 2016-05-13: 20 mg via INTRAVENOUS
  Administered 2016-05-13: 40 mg via INTRAVENOUS
  Administered 2016-05-13: 80 mg via INTRAVENOUS
  Filled 2016-05-13: qty 8
  Filled 2016-05-13: qty 16
  Filled 2016-05-13: qty 8

## 2016-05-13 MED ORDER — ZOLPIDEM TARTRATE 5 MG PO TABS: 5.0000 mg | ORAL_TABLET | Freq: Every evening | ORAL | Status: DC | PRN

## 2016-05-13 MED ORDER — SIMETHICONE 80 MG PO CHEW: 80.0000 mg | CHEWABLE_TABLET | ORAL | Status: DC | PRN

## 2016-05-13 MED ORDER — ONDANSETRON HCL 4 MG/2ML IJ SOLN: 4.0000 mg | Freq: Three times a day (TID) | INTRAMUSCULAR | Status: DC | PRN

## 2016-05-13 MED ORDER — MENTHOL 3 MG MT LOZG: 1.0000 | LOZENGE | OROMUCOSAL | Status: DC | PRN

## 2016-05-13 MED ORDER — NALOXONE HCL 0.4 MG/ML IJ SOLN: 0.4000 mg | INTRAMUSCULAR | Status: DC | PRN

## 2016-05-13 MED ORDER — SCOPOLAMINE 1 MG/3DAYS TD PT72: 1.0000 | MEDICATED_PATCH | Freq: Once | TRANSDERMAL | Status: DC

## 2016-05-13 MED ORDER — FLUOXETINE HCL 20 MG PO CAPS
20.0000 mg | ORAL_CAPSULE | Freq: Every day | ORAL | Status: DC
  Administered 2016-05-13 – 2016-05-16 (×4): 20 mg via ORAL
  Filled 2016-05-13 (×5): qty 1

## 2016-05-13 MED ORDER — OXYTOCIN 40 UNITS IN LACTATED RINGERS INFUSION - SIMPLE MED
2.5000 [IU]/h | INTRAVENOUS | Status: AC
  Filled 2016-05-13: qty 1000

## 2016-05-13 MED ORDER — LABETALOL HCL 5 MG/ML IV SOLN
INTRAVENOUS | Status: AC
  Administered 2016-05-14: 20 mg via INTRAVENOUS
  Filled 2016-05-13: qty 4

## 2016-05-13 MED ORDER — ONDANSETRON HCL 4 MG/2ML IJ SOLN
INTRAMUSCULAR | Status: AC
  Filled 2016-05-13: qty 2

## 2016-05-13 MED ORDER — ACETAMINOPHEN 325 MG PO TABS: 650.0000 mg | ORAL_TABLET | ORAL | Status: DC | PRN

## 2016-05-13 MED ORDER — MORPHINE SULFATE (PF) 0.5 MG/ML IJ SOLN
INTRAMUSCULAR | Status: DC | PRN
  Administered 2016-05-13: .1 mg via INTRATHECAL

## 2016-05-13 MED ORDER — SODIUM CHLORIDE 0.9% FLUSH
INTRAVENOUS | Status: AC
  Filled 2016-05-13: qty 6

## 2016-05-13 MED ORDER — IBUPROFEN 600 MG PO TABS
600.0000 mg | ORAL_TABLET | Freq: Four times a day (QID) | ORAL | Status: DC
  Administered 2016-05-14 – 2016-05-16 (×10): 600 mg via ORAL
  Filled 2016-05-13 (×12): qty 1

## 2016-05-13 MED ORDER — ONDANSETRON HCL 4 MG/2ML IJ SOLN
INTRAMUSCULAR | Status: DC | PRN
  Administered 2016-05-13: 4 mg via INTRAVENOUS

## 2016-05-13 MED ORDER — KETOROLAC TROMETHAMINE 30 MG/ML IJ SOLN
INTRAMUSCULAR | Status: AC
  Filled 2016-05-13: qty 1

## 2016-05-13 MED ORDER — DIPHENHYDRAMINE HCL 25 MG PO CAPS: 25.0000 mg | ORAL_CAPSULE | Freq: Four times a day (QID) | ORAL | Status: DC | PRN

## 2016-05-13 MED ORDER — LACTATED RINGERS IV SOLN
INTRAVENOUS | Status: DC
  Administered 2016-05-13 (×2): via INTRAVENOUS

## 2016-05-13 MED ORDER — KETOROLAC TROMETHAMINE 30 MG/ML IJ SOLN
30.0000 mg | Freq: Four times a day (QID) | INTRAMUSCULAR | Status: DC | PRN
  Administered 2016-05-13: 30 mg via INTRAMUSCULAR

## 2016-05-13 MED ORDER — MAGNESIUM SULFATE BOLUS VIA INFUSION
4.0000 g | Freq: Once | INTRAVENOUS | Status: AC
  Administered 2016-05-13: 4 g via INTRAVENOUS
  Filled 2016-05-13: qty 500

## 2016-05-13 MED ORDER — DIBUCAINE 1 % RE OINT: 1.0000 "application " | TOPICAL_OINTMENT | RECTAL | Status: DC | PRN

## 2016-05-13 MED ORDER — NALBUPHINE HCL 10 MG/ML IJ SOLN: 5.0000 mg | INTRAMUSCULAR | Status: DC | PRN

## 2016-05-13 MED ORDER — OXYCODONE-ACETAMINOPHEN 5-325 MG PO TABS: 2.0000 | ORAL_TABLET | ORAL | Status: DC | PRN

## 2016-05-13 MED ORDER — SIMETHICONE 80 MG PO CHEW
80.0000 mg | CHEWABLE_TABLET | ORAL | Status: DC
  Administered 2016-05-14 (×2): 80 mg via ORAL
  Filled 2016-05-13 (×2): qty 1

## 2016-05-13 MED ORDER — SCOPOLAMINE 1 MG/3DAYS TD PT72
MEDICATED_PATCH | TRANSDERMAL | Status: DC | PRN
  Administered 2016-05-13: 1 via TRANSDERMAL

## 2016-05-13 MED ORDER — OXYTOCIN 40 UNITS IN LACTATED RINGERS INFUSION - SIMPLE MED
INTRAVENOUS | Status: DC | PRN
  Administered 2016-05-13: 40 [IU] via INTRAVENOUS

## 2016-05-13 MED ORDER — PRENATAL MULTIVITAMIN CH
1.0000 | ORAL_TABLET | Freq: Every day | ORAL | Status: DC
  Administered 2016-05-16: 1 via ORAL
  Filled 2016-05-13 (×2): qty 1

## 2016-05-13 MED ORDER — OXYCODONE-ACETAMINOPHEN 5-325 MG PO TABS: 1.0000 | ORAL_TABLET | ORAL | Status: DC | PRN

## 2016-05-13 MED ORDER — SODIUM CHLORIDE 0.9% FLUSH: 3.0000 mL | INTRAVENOUS | Status: DC | PRN

## 2016-05-13 MED ORDER — SIMETHICONE 80 MG PO CHEW
80.0000 mg | CHEWABLE_TABLET | Freq: Three times a day (TID) | ORAL | Status: DC
  Administered 2016-05-14 – 2016-05-16 (×6): 80 mg via ORAL
  Filled 2016-05-13 (×7): qty 1

## 2016-05-13 MED ORDER — BUPIVACAINE IN DEXTROSE 0.75-8.25 % IT SOLN
INTRATHECAL | Status: DC | PRN
  Administered 2016-05-13: 1.6 mL via INTRATHECAL

## 2016-05-13 MED ORDER — OXYTOCIN 10 UNIT/ML IJ SOLN
INTRAMUSCULAR | Status: AC
  Filled 2016-05-13: qty 4

## 2016-05-13 MED ORDER — FENTANYL CITRATE (PF) 100 MCG/2ML IJ SOLN
INTRAMUSCULAR | Status: DC | PRN
  Administered 2016-05-13: 15 ug via INTRATHECAL

## 2016-05-13 MED ORDER — MAGNESIUM SULFATE 50 % IJ SOLN
2.0000 g/h | INTRAVENOUS | Status: DC
  Administered 2016-05-13 – 2016-05-14 (×2): 2 g/h via INTRAVENOUS
  Filled 2016-05-13 (×2): qty 80

## 2016-05-13 MED ORDER — PHENYLEPHRINE 8 MG IN D5W 100 ML (0.08MG/ML) PREMIX OPTIME
INJECTION | INTRAVENOUS | Status: AC
  Filled 2016-05-13: qty 100

## 2016-05-13 MED ORDER — PROMETHAZINE HCL 25 MG/ML IJ SOLN: 6.2500 mg | INTRAMUSCULAR | Status: DC | PRN

## 2016-05-13 SURGICAL SUPPLY — 35 items
BENZOIN TINCTURE PRP APPL 2/3 (GAUZE/BANDAGES/DRESSINGS) ×2 IMPLANT
CHLORAPREP W/TINT 26ML (MISCELLANEOUS) ×2 IMPLANT
CLAMP CORD UMBIL (MISCELLANEOUS) IMPLANT
CLOSURE STERI STRIP 1/2 X4 (GAUZE/BANDAGES/DRESSINGS) ×2 IMPLANT
CLOTH BEACON ORANGE TIMEOUT ST (SAFETY) ×2 IMPLANT
CONTAINER PREFILL 10% NBF 15ML (MISCELLANEOUS) IMPLANT
DRSG OPSITE POSTOP 4X10 (GAUZE/BANDAGES/DRESSINGS) ×2 IMPLANT
ELECT REM PT RETURN 9FT ADLT (ELECTROSURGICAL) ×2
ELECTRODE REM PT RTRN 9FT ADLT (ELECTROSURGICAL) ×1 IMPLANT
EXTRACTOR VACUUM M CUP 4 TUBE (SUCTIONS) IMPLANT
GLOVE BIO SURGEON STRL SZ 6.5 (GLOVE) ×2 IMPLANT
GLOVE BIOGEL PI IND STRL 7.0 (GLOVE) ×2 IMPLANT
GLOVE BIOGEL PI INDICATOR 7.0 (GLOVE) ×2
GOWN STRL REUS W/TWL LRG LVL3 (GOWN DISPOSABLE) ×4 IMPLANT
KIT ABG SYR 3ML LUER SLIP (SYRINGE) IMPLANT
NEEDLE HYPO 22GX1.5 SAFETY (NEEDLE) IMPLANT
NEEDLE HYPO 25X5/8 SAFETYGLIDE (NEEDLE) IMPLANT
NS IRRIG 1000ML POUR BTL (IV SOLUTION) ×2 IMPLANT
PACK C SECTION WH (CUSTOM PROCEDURE TRAY) ×2 IMPLANT
PAD OB MATERNITY 4.3X12.25 (PERSONAL CARE ITEMS) ×2 IMPLANT
PENCIL SMOKE EVAC W/HOLSTER (ELECTROSURGICAL) ×2 IMPLANT
STRIP CLOSURE SKIN 1/2X4 (GAUZE/BANDAGES/DRESSINGS) ×2 IMPLANT
SUT MON AB 4-0 PS1 27 (SUTURE) ×2 IMPLANT
SUT PLAIN 0 NONE (SUTURE) IMPLANT
SUT PLAIN 2 0 (SUTURE) ×1
SUT PLAIN 2 0 XLH (SUTURE) IMPLANT
SUT PLAIN ABS 2-0 CT1 27XMFL (SUTURE) ×1 IMPLANT
SUT VIC AB 0 CT1 36 (SUTURE) ×4 IMPLANT
SUT VIC AB 0 CTX 36 (SUTURE) ×2
SUT VIC AB 0 CTX36XBRD ANBCTRL (SUTURE) ×2 IMPLANT
SUT VIC AB 2-0 CT1 27 (SUTURE) ×1
SUT VIC AB 2-0 CT1 TAPERPNT 27 (SUTURE) ×1 IMPLANT
SYR CONTROL 10ML LL (SYRINGE) IMPLANT
TOWEL OR 17X24 6PK STRL BLUE (TOWEL DISPOSABLE) ×2 IMPLANT
TRAY FOLEY CATH SILVER 14FR (SET/KITS/TRAYS/PACK) IMPLANT

## 2016-05-13 NOTE — Brief Op Note (Signed)
05/09/2016 - 05/13/2016  5:52 PM  PATIENT:  Kidder  35 y.o. female  PRE-OPERATIVE DIAGNOSIS:  Elective c/s secondary to pre eclampsia with severe range bps, remote from delivery.   POST-OPERATIVE DIAGNOSIS:  Elective c/s secondary to pre eclampsia with severe range bps, remote from delivery.  PROCEDURE:  Procedure(s): CESAREAN SECTION (N/A)  PCS LTCS, 2 layer closure  SURGEON:  Surgeon(s) and Role:    * Aloha Gell, MD - Primary  PHYSICIAN ASSISTANT:   ASSISTANTS: Renato Battles, CNM   ANESTHESIA:   spinal  EBL:  Total I/O In: 1250 [I.V.:1250] Out: 600 [Urine:100; Blood:500]  BLOOD ADMINISTERED:none  DRAINS: Urinary Catheter (Foley)   LOCAL MEDICATIONS USED:  NONE  SPECIMEN:  Source of Specimen:  placenta  DISPOSITION OF SPECIMEN:  L&D  COUNTS:  YES  TOURNIQUET:  * No tourniquets in log *  DICTATION: .Note written in EPIC  PLAN OF CARE: Admit to inpatient   PATIENT DISPOSITION:  PACU - hemodynamically stable.   Delay start of Pharmacological VTE agent (>24hrs) due to surgical blood loss or risk of bleeding: yes

## 2016-05-13 NOTE — Anesthesia Postprocedure Evaluation (Signed)
Anesthesia Post Note  Patient: Kara Murphy  Procedure(s) Performed: Procedure(s) (LRB): CESAREAN SECTION (N/A)  Patient location during evaluation: PACU Anesthesia Type: Spinal Level of consciousness: oriented and awake and alert Pain management: pain level controlled Vital Signs Assessment: post-procedure vital signs reviewed and stable Respiratory status: spontaneous breathing, respiratory function stable and patient connected to nasal cannula oxygen Cardiovascular status: blood pressure returned to baseline and stable Postop Assessment: no headache, no backache and spinal receding Anesthetic complications: no     Last Vitals:  Vitals:   05/13/16 1816 05/13/16 1830  BP: (!) 159/98 (!) 149/97  Pulse:  70  Resp:  16  Temp:      Last Pain:  Vitals:   05/13/16 1830  TempSrc:   PainSc: 0-No pain   Pain Goal: Patients Stated Pain Goal: 2 (05/12/16 2041)    LLE Sensation: Tingling (05/13/16 1830)   RLE Sensation: Tingling (05/13/16 1830)      Effie Berkshire

## 2016-05-13 NOTE — Transfer of Care (Signed)
Immediate Anesthesia Transfer of Care Note  Patient: Kara Murphy  Procedure(s) Performed: Procedure(s): CESAREAN SECTION (N/A)  Patient Location: PACU  Anesthesia Type:Spinal  Level of Consciousness: awake, alert  and oriented  Airway & Oxygen Therapy: Patient Spontanous Breathing  Post-op Assessment: Report given to RN and Post -op Vital signs reviewed and stable  Post vital signs: Reviewed and stable  Last Vitals:  Vitals:   05/13/16 1440 05/13/16 1528  BP: (!) 179/92   Pulse: 70   Resp:  18  Temp:      Last Pain:  Vitals:   05/13/16 1209  TempSrc: Oral  PainSc:       Patients Stated Pain Goal: 2 ([id number])  Complications: No apparent anesthesia complications

## 2016-05-13 NOTE — Op Note (Signed)
05/09/2016 - 05/13/2016  5:52 PM  PATIENT:  Kara Murphy  35 y.o. female  PRE-OPERATIVE DIAGNOSIS:  Elective c/s secondary to pre eclampsia with severe range bps, remote from delivery.   POST-OPERATIVE DIAGNOSIS:  Elective c/s secondary to pre eclampsia with severe range bps, remote from delivery.  PROCEDURE:  Procedure(s): CESAREAN SECTION (N/A)  PCS LTCS, 2 layer closure  SURGEON:  Surgeon(s) and Role:    * Aloha Gell, MD - Primary  PHYSICIAN ASSISTANT:   ASSISTANTS: Renato Battles, CNM   ANESTHESIA:   spinal  EBL:  Total I/O In: 1250 [I.V.:1250] Out: 600 [Urine:100; Blood:500]  BLOOD ADMINISTERED:none  DRAINS: Urinary Catheter (Foley)   LOCAL MEDICATIONS USED:  NONE  SPECIMEN:  Source of Specimen:  placenta  DISPOSITION OF SPECIMEN:  L&D  COUNTS:  YES  TOURNIQUET:  * No tourniquets in log *  DICTATION: .Note written in EPIC  PLAN OF CARE: Admit to inpatient   PATIENT DISPOSITION:  PACU - hemodynamically stable.   Delay start of Pharmacological VTE agent (>24hrs) due to surgical blood loss or risk of bleeding: yes     Findings:  @BABYSEXEBC @ infant,  APGAR (1 MIN): 8   APGAR (5 MINS): 9   APGAR (10 MINS):   Normal uterus, tubes and ovaries, normal placenta. 3VC, clear amniotic fluid  EBL: 500 cc Antibiotics:   2g Ancef Complications: none  Indications: This is a 35 y.o. year-old, G1  At [redacted]w[redacted]d admitted for PEC with severe range bps. Also significant h/o anxiety. Pt's bps have not been controlled since admission and while much of her hypertension and symptoms are due to anxiety, severe range bp's persist and decision for delivery made now that 35 wks and s/p BMZ. Pt elected PCS over IOL. Risks benefits and alternatives of the procedure were discussed with the patient who agreed to proceed  Procedure:  After informed consent was obtained the patient was taken to the operating room where spinal anesthesia was initiated.  She was prepped and draped in the  normal sterile fashion in dorsal supine position with a leftward tilt.  A foley catheter was in place.  A Pfannenstiel skin incision was made 2 cm above the pubic symphysis in the midline with the scalpel.  Dissection was carried down with the Bovie cautery until the fascia was reached. The fascia was incised in the midline. The incision was extended laterally with the Mayo scissors. The inferior aspect of the fascial incision was grasped with the Coker clamps, elevated up and the underlying rectus muscles were dissected off sharply. The superior aspect of the fascial incision was grasped with the Coker clamps elevated up and the underlying rectus muscles were dissected off sharply. The pyramidalis muscles were separated sharply. The peritoneum was entered bluntly. The peritoneal incision was extended superiorly and inferiorly with good visualization of the bladder. The bladder blade was inserted and palpation was done to assess the fetal position and the location of the uterine vessels. The lower segment of the uterus was incised sharply with the scalpel and extended  bluntly in the cephalo-caudal fashion. The infant was grasped, brought to the incision,  rotated and the infant was delivered with fundal pressure. Given vigorous fetal status, 1 minute delay was allowed before the cord was clamped and cut. The infant was handed off to the waiting pediatrician. The placenta was expressed. The uterus was exteriorized. The uterus was cleared of all clots and debris. The uterine incision was repaired with 0 Vicryl in a running locked fashion.  A second layer of the same suture was used in an imbricating fashion to obtain excellent hemostasis.  The uterus was then returned to the abdomen, the gutters were cleared of all clots and debris. The uterine incision was reinspected and found to be hemostatic. The peritoneum was grasped and closed with 2-0 Vicryl in a running fashion. The cut muscle edges and the underside of the  fascia were inspected and found to be hemostatic. The fascia was closed with 0 Vicryl in a single half. The subcutaneous tissue was irrigated. Scarpa's layer was closed with a 2-0 plain gut suture. The skin was closed with a 4-0 Monocryl in a single layer. The patient tolerated the procedure well. Sponge lap and needle counts were correct x3 and patient was taken to the recovery room in a stable condition.  Shiraz Bastyr A. 05/13/2016 5:53 PM

## 2016-05-13 NOTE — Anesthesia Preprocedure Evaluation (Addendum)
Anesthesia Evaluation  Patient identified by MRN, date of birth, ID band Patient awake    Reviewed: Allergy & Precautions, NPO status , Patient's Chart, lab work & pertinent test results  History of Anesthesia Complications Negative for: history of anesthetic complications  Airway Mallampati: II  TM Distance: >3 FB Neck ROM: Full    Dental  (+) Teeth Intact, Dental Advisory Given   Pulmonary neg shortness of breath, neg sleep apnea, neg COPD, neg recent URI, former smoker,    Pulmonary exam normal breath sounds clear to auscultation       Cardiovascular hypertension (severe pre-eclampsia), Pt. on medications (-) angina Rhythm:Regular Rate:Normal     Neuro/Psych PSYCHIATRIC DISORDERS Anxiety negative neurological ROS     GI/Hepatic Neg liver ROS, GERD  ,  Endo/Other  negative endocrine ROS  Renal/GU negative Renal ROS     Musculoskeletal   Abdominal   Peds  Hematology negative hematology ROS (+)   Anesthesia Other Findings   Reproductive/Obstetrics (+) Pregnancy                          Anesthesia Physical Anesthesia Plan  ASA: III  Anesthesia Plan: Spinal   Post-op Pain Management:    Induction:   Airway Management Planned: Natural Airway  Additional Equipment:   Intra-op Plan:   Post-operative Plan:   Informed Consent:   Plan Discussed with: CRNA  Anesthesia Plan Comments: (I have discussed risks of neuraxial anesthesia including but not limited to infection, bleeding, nerve injury, back pain, headache, seizures, and failure of block. Patient denies bleeding disorders and is not currently anticoagulated. Labs have been reviewed. Risks and benefits discussed. All patient's questions answered.   Platelets 222 - repeat CBC pending and to be reviewed prior to start of case)       Anesthesia Quick Evaluation

## 2016-05-13 NOTE — Progress Notes (Signed)
HD# 5 Severe PEC with anxiety component  Pt notes 'shaking" with anxiety, unable to control her symptoms. No HA, no vision change, no RUQ pain. No ctx, no LOF, no VB, good FM  Pt just completed IV labetalol 20 then 40 then 80 mg  O:  Vitals:   05/13/16 1328 05/13/16 1421 05/13/16 1435 05/13/16 1440  BP: (!) 158/92  (!) 179/92 (!) 179/92  Pulse: 78  70 70  Resp: 18 18 18    Temp:      TempSrc:      SpO2:      Weight:      Height:       Gen: anxious Abd: NT, gravid, no RUQ pain GU: deferred now- done by Dr. Ronita Hipps this am- long/ closed  A/P: PEC, severe by bp criteria though significant anxiety component. S/p BMZ and Mag. Now needing repeat IV push meds despite increasing oral labetalol since admission to 300 q 8 hrs. Unclear if high bp's represent anxiety vs PEC but given pt is at 35 wks recommend move to delivery. MFM consulted by phone yesterday and are in agreement. Options for IOL vs PCS d/w pt. Pt planning 1 possible additional pregnancy, though unsure. Pt very nervous about the possibility of a prolonged IOL with worsening anxiety and htn. Pt does not think she can mentally undergo an IOL and requests PCS. Risks of c/s to include bleeding, infection, damage to surrounding organs, increased complications with either RCS or VBAC in subsequent pregnancy d/w pt. Also d/w pt increase risk of blood loss, pulmonary edema and fluid shifts with c/s in setting of PEC. We did discuss unclear how she would progress with IOL- may take >24 hrs due to need for ripening but may progress faster given PEC disease. After R/B and consideration of her particular needs, will proceed with PCS. Plan PP Magnesium.   NICU, anasthesia and OR team aware.  Linsey Arteaga A. 05/13/2016 3:48 PM

## 2016-05-13 NOTE — Progress Notes (Signed)
Patient ID: Kara Murphy, female   DOB: [birth year], 35 y.o.   MRN: [id number] HD #4 33w 0d PEC on MgSO4  S: No headaches tpday. No visual changes or epigastric pain. No CP, No SOB. Good FM. No LOF.  O: Patient Vitals for the past 24 hrs:  BP Temp Temp src Pulse Resp SpO2  05/13/16 1045 - - - - 18 -  05/13/16 1015 (!) 165/94 - - 77 18 -  05/13/16 0853 (!) 162/82 98.2 F (36.8 C) Oral 80 18 -  05/13/16 0613 (!) 173/100 - - 82 18 -  05/12/16 2251 (!) 166/94 98 F (36.7 C) Oral 83 18 -  05/12/16 2041 (!) 164/96 - - 71 18 -  05/12/16 2036 (!) 180/99 97.9 F (36.6 C) Oral 77 18 99 %  05/12/16 1701 (!) 171/88 98.5 F (36.9 C) - 70 18 -  05/12/16 1431 (!) 156/92 - - - - -  05/12/16 1307 (!) 170/87 - - - - -  05/12/16 1306 (!) 172/84 98.4 F (36.9 C) - 64 18 -  05/12/16 1145 (!) 164/84 - - - - -   NCAT Neck supple with FROM Lungs: CTA CV: RRR ABD: gravid, NT VE: closed/thick/-2 No CVAT EXT: DTRs 3+, no clonus Neuro: nonfocal Skin : intact CBC    Component Value Date/Time   WBC 17.0 (H) 05/12/2016 0609   RBC 3.94 05/12/2016 0609   HGB 12.1 05/12/2016 0609   HCT 34.7 (L) 05/12/2016 0609   PLT 222 05/12/2016 0609   MCV 88.1 05/12/2016 0609   MCH 30.7 05/12/2016 0609   MCHC 34.9 05/12/2016 0609   RDW 14.2 05/12/2016 0609   CMP     Component Value Date/Time   NA 137 05/12/2016 0609   K 4.0 05/12/2016 0609   CL 108 05/12/2016 0609   CO2 20 (L) 05/12/2016 0609   GLUCOSE 99 05/12/2016 0609   BUN 13 05/12/2016 0609   CREATININE 0.69 05/12/2016 0609   CALCIUM 8.5 (L) 05/12/2016 0609   PROT 5.4 (L) 05/12/2016 0609   ALBUMIN 2.6 (L) 05/12/2016 0609   AST 28 05/12/2016 0609   ALT 19 05/12/2016 0609   ALKPHOS 105 05/12/2016 0609   BILITOT 0.5 05/12/2016 0609   GFRNONAA >60 05/12/2016 0609   GFRAA >60 05/12/2016 0609  Sono c/w aGA, cephalic, nl afi. 24UTP confirms PEC- 1216mg   IMP: 35 weeks 0d IUP PEC - labile BP off MgSo4. Stable labs. Reassuring fetal  status. Severe anxiety- uncontrolled on Prozac and Buspar  Plan: BMZ completed Continue Labetalol- IV if severe range BP Discussed delivery options and unfavorable.cervix. IOL vs primary csection. Pt prefers csection. Risks vs benefits discussed. NPO, Start IV. Rpt labs this afternoon.

## 2016-05-13 NOTE — Anesthesia Procedure Notes (Signed)
Spinal  Patient location during procedure: OR Start time: 05/13/2016 4:30 PM End time: 05/13/2016 4:33 PM Staffing Anesthesiologist: Nilda Simmer Performed: anesthesiologist  Preanesthetic Checklist Completed: patient identified, surgical consent, pre-op evaluation, timeout performed, IV checked, risks and benefits discussed and monitors and equipment checked Spinal Block Patient position: sitting Prep: ChloraPrep Patient monitoring: heart rate, cardiac monitor, continuous pulse ox and blood pressure Approach: midline Location: L2-3 Injection technique: single-shot Needle Needle type: Pencan and Introducer  Needle gauge: 24 G Needle length: 9 cm Assessment Sensory level: T4

## 2016-05-14 LAB — CBC
HCT: 34.5 % — ABNORMAL LOW (ref 36.0–46.0)
HEMOGLOBIN: 12.2 g/dL (ref 12.0–15.0)
MCH: 30.8 pg (ref 26.0–34.0)
MCHC: 35.4 g/dL (ref 30.0–36.0)
MCV: 87.1 fL (ref 78.0–100.0)
PLATELETS: 218 10*3/uL (ref 150–400)
RBC: 3.96 MIL/uL (ref 3.87–5.11)
RDW: 13.6 % (ref 11.5–15.5)
WBC: 16.2 10*3/uL — ABNORMAL HIGH (ref 4.0–10.5)

## 2016-05-14 LAB — MAGNESIUM: Magnesium: 4.9 mg/dL — ABNORMAL HIGH (ref 1.7–2.4)

## 2016-05-14 MED ORDER — LABETALOL HCL 5 MG/ML IV SOLN
INTRAVENOUS | Status: AC
  Filled 2016-05-14: qty 4

## 2016-05-14 MED ORDER — LABETALOL HCL 200 MG PO TABS
300.0000 mg | ORAL_TABLET | Freq: Three times a day (TID) | ORAL | Status: DC
  Administered 2016-05-14 – 2016-05-15 (×3): 300 mg via ORAL
  Filled 2016-05-14 (×3): qty 1

## 2016-05-14 MED ORDER — LACTATED RINGERS IV SOLN
INTRAVENOUS | Status: DC
  Administered 2016-05-14 – 2016-05-15 (×3): via INTRAVENOUS

## 2016-05-14 MED ORDER — LABETALOL HCL 5 MG/ML IV SOLN
20.0000 mg | Freq: Once | INTRAVENOUS | Status: AC
  Administered 2016-05-14: 20 mg via INTRAVENOUS

## 2016-05-14 NOTE — Progress Notes (Signed)
RN set up double electric breast pump, taught hand expression, pump use and care.   Pt and husband hand expressed before and after initial pumping and got several large drops that covered the bottom of the colostrum vial.   Pt expressed excitement and a gain in confidence that she would be able to feed her baby. Plans to hand express and pump every 3 hrs.   Teaching on Milk Storage and Reiterating cleaning and care is needed.

## 2016-05-14 NOTE — Lactation Note (Signed)
This note was copied from a baby's chart. Lactation Consultation Note  Patient Name: Girl [place] Today's Date: 05/14/2016 Reason for consult: Initial assessment;NICU baby;Infant < 6lbs;Late preterm infant (SGA), mom 16 hours post partum, and baby SGA, just under 5 pounds, a. Mom had begun pumping and hand expression prior to my consult. I observed her pumping, and decreased her to 21 flanges. She may have to go up to 24 flanges, but is using coconut oil with pumping. Mom was able to express colostrum both with pumping and hand expression, a total of 2-3 ml's. Mom demonstrated good technique with HE, and dad also assisted mom with HE, and pump part care. NICU book reviewed, mom encouraged to hold baby skin to skin, and latch baby to her breast, if stable enough to do so, as per NICU team. Mom knows to call for questions/conerns.    Maternal Data Formula Feeding for Exclusion: Yes (mom on mag, baby in NICU) Has patient been taught Hand Expression?: Yes Does the patient have breastfeeding experience prior to this delivery?: No  Feeding    LATCH Score/Interventions       Type of Nipple: Everted at rest and after stimulation  Comfort (Breast/Nipple): Soft / non-tender           Lactation Tools Discussed/Used WIC Program: No Pump Review: Setup, frequency, and cleaning;Milk Storage;Other (comment) (NICU booklet, pump use and settings, hand expression) Initiated by:: Bedside RN Date initiated:: 05/14/16   Consult Status Consult Status: Follow-up Date: 05/15/16 Follow-up type: In-patient    Tonna Corner 05/14/2016, 10:37 AM

## 2016-05-14 NOTE — Lactation Note (Signed)
This note was copied from a baby's chart. Lactation Consultation Note  Patient Name: Girl [place] Today's Date: 05/14/2016   Follow-up with mom in NICU at 85 hrs old.  Infant was skin-to-skin asleep with mom when [facility] entered room.  NICU RN stated they tried to latch and baby took a couple sucks but then went to sleep.   LC discussed with parents normal preterm behavior, importance of STS, and importance of continuing to pump.   Encouraged mom to use hands-on pumping with hand expression at end of pumping session.   Mom had difficulty concentrating since she has been on MgSO4, but dad was retaining information and asking appropriate questions.   Will follow-up with parents tomorrow.    Merlene Laughter 05/14/2016, 5:33 PM

## 2016-05-14 NOTE — Progress Notes (Signed)
I offered support to pt while rounding on unit.  She was in good spirits, and eager to go see her baby.  She has good support and although she wishes that she and her baby were not separated, she is coping well.    [place], [place] Pager, [phone] 3:33 PM    05/14/16 1500  Clinical Encounter Type  Visited With Patient

## 2016-05-14 NOTE — Progress Notes (Signed)
Patient taken to NICU to visit baby viz wheelchair.

## 2016-05-15 LAB — CBC
HCT: 33.5 % — ABNORMAL LOW (ref 36.0–46.0)
Hemoglobin: 11.9 g/dL — ABNORMAL LOW (ref 12.0–15.0)
MCH: 31.1 pg (ref 26.0–34.0)
MCHC: 35.5 g/dL (ref 30.0–36.0)
MCV: 87.5 fL (ref 78.0–100.0)
Platelets: 244 10*3/uL (ref 150–400)
RBC: 3.83 MIL/uL — ABNORMAL LOW (ref 3.87–5.11)
RDW: 13.7 % (ref 11.5–15.5)
WBC: 15.3 10*3/uL — ABNORMAL HIGH (ref 4.0–10.5)

## 2016-05-15 LAB — COMPREHENSIVE METABOLIC PANEL
ALT: 16 U/L (ref 14–54)
AST: 21 U/L (ref 15–41)
Albumin: 2.3 g/dL — ABNORMAL LOW (ref 3.5–5.0)
Alkaline Phosphatase: 77 U/L (ref 38–126)
Anion gap: 7 (ref 5–15)
BUN: 15 mg/dL (ref 6–20)
CO2: 23 mmol/L (ref 22–32)
Calcium: 7.1 mg/dL — ABNORMAL LOW (ref 8.9–10.3)
Chloride: 104 mmol/L (ref 101–111)
Creatinine, Ser: 0.67 mg/dL (ref 0.44–1.00)
GFR calc Af Amer: >60 mL/min (ref >60)
GFR calc non Af Amer: >60 mL/min (ref >60)
Glucose, Bld: 78 mg/dL (ref 65–99)
Potassium: 4 mmol/L (ref 3.5–5.1)
Sodium: 134 mmol/L — ABNORMAL LOW (ref 135–145)
Total Bilirubin: 0.5 mg/dL (ref 0.3–1.2)
Total Protein: 5.4 g/dL — ABNORMAL LOW (ref 6.5–8.1)

## 2016-05-15 LAB — URIC ACID: Uric Acid, Serum: 5.4 mg/dL (ref 2.3–6.6)

## 2016-05-15 MED ORDER — NIFEDIPINE ER OSMOTIC RELEASE 30 MG PO TB24
30.0000 mg | ORAL_TABLET | Freq: Every day | ORAL | Status: DC
  Administered 2016-05-15 – 2016-05-16 (×2): 30 mg via ORAL
  Filled 2016-05-15 (×2): qty 1

## 2016-05-15 NOTE — Progress Notes (Signed)
POSTOPERATIVE DAY # 1 S/P primary CS  S:         Reports feeling tired             Tolerating po intake / no nausea / no vomiting / no flatus / no BM             Bleeding is light             Pain controlled withnone             Not ambulatory  O:  VS: 166/94 and 158/91              Temp 97.8  LABS:               Recent Labs  05/13/16 1459 05/14/16 0529  WBC 14.9* 16.2*  HGB 12.7 12.2  PLT 221 218               Bloodtype: --/--/O POS (11/20 [id number])  Rubella: Immune (05/23 0000)                                            I&O: +1108 ml             Physical Exam:             Alert and Oriented X3  Lungs: Clear and unlabored  Heart: regular rate and rhythm / no mumurs  Abdomen: soft, non-tender, non-distended             Fundus: firm, non-tender             Dressing intact           Lochia: light  Extremities: trace edema, no calf pain or tenderness, SCD  A:        POD # 1 S/P primary CS            Preeclampsia - no diuresis / magnesium and labetalol  P:        Routine postoperative care              Continue current management plan - repeat labs in am          Artelia Laroche CNM, MSN, [facility]

## 2016-05-15 NOTE — Progress Notes (Signed)
Mag d/c at this time per CNM order, at bedside. I escorted pt to NICU to spend time with baby, FOB is with her and will escort her back. I advised them to call me if she begins to feel bad or needs any assistance coming back to unit. Kara Murphy

## 2016-05-15 NOTE — Lactation Note (Signed)
This note was copied from a baby's chart. Lactation Consultation Note  Patient Name: Kara Murphy Today's Date: 05/15/2016 Reason for consult: Follow-up assessment;NICU baby;Infant < 6lbs;Late preterm infant   Follow up with mom of 82 hour old NICU infant. Mom reports she is pumping every 3 hours and getting 1-3 cc colostrum, mom reports she is hand expressing post pumping. Mom reports she does not have a pump at home. Family is planning to call insurance company to inquire about a pump. Discussed 2 week [employer] rental with parents and informed them of pumps located in NICU. Parents to let [facility] know what they would like to do at d/c.   Mom reports infant is doing well and feeding. Mom reports she has been able to hold her STS. Enc mom to keep up the good work and to call with any questions/concerns. Follow up tomorrow and prn.    Maternal Data Formula Feeding for Exclusion: No Has patient been taught Hand Expression?: Yes  Feeding Feeding Type: Donor Breast Milk Length of feed: 20 min  LATCH Score/Interventions                      Lactation Tools Discussed/Used WIC Program: No Pump Review: Setup, frequency, and cleaning   Consult Status Consult Status: Follow-up Date: 05/16/16 Follow-up type: In-patient    Kara Murphy Kara Murphy 05/15/2016, 1:28 PM

## 2016-05-15 NOTE — Progress Notes (Addendum)
At 1308, pt returned from NICU, NT took BP and result was 169/103, pt was asymptomatic, rechecked in 1 hour at 1405 after encouraging pt to rest,  and result was much improved, 158/84. Pt resting well, no complaints at this time. Marry Guan

## 2016-05-15 NOTE — Progress Notes (Signed)
Subjective: POD# 1 Information for the patient's newborn:  Kara, Hodapp Murphy [[id number]]  female  Baby name: Kara Murphy / NICU, prematurity  Reports feeling well, better than yesterday, in good spirits.  Feeding: breast / pumping Patient reports tolerating PO.  Breast symptoms: none Pain controlled withPO meds Denies HA/SOB/C/P/N/V/dizziness. Flatus present, + BM today. She reports vaginal bleeding as normal, without clots.  She is ambulating, urinating without difficulty.     Objective:   VS:    Vitals:   05/15/16 0500 05/15/16 0700 05/15/16 0801 05/15/16 0900  BP:   (!) 149/94   Pulse:   69   Resp: _0 Temp:   97.8 F (36.6 C)   TempSrc:   Oral   SpO2: 97%  97%   Weight:      Height:         Intake/Output Summary (Last 24 hours) at 05/15/16 1029 Last data filed at 05/15/16 0900  Gross per 24 hour  Intake             4455 ml  Output             6750 ml  Net            -2295 ml        Recent Labs  05/14/16 0529 05/15/16 0546  WBC 16.2* 15.3*  HGB 12.2 11.9*  HCT 34.5* 33.5*  PLT 218 244   Hepatic Function Latest Ref Rng & Units 05/15/2016 05/13/2016 05/12/2016  Total Protein 6.5 - 8.1 g/dL 5.4(L) 5.6(L) 5.4(L)  Albumin 3.5 - 5.0 g/dL 2.3(L) 2.6(L) 2.6(L)  AST 15 - 41 U/L _1 ALT 14 - 54 U/L _2 Alk Phosphatase 38 - 126 U/L 77 99 105  Total Bilirubin 0.3 - 1.2 mg/dL 0.5 0.6 0.5    Blood type: --/--/O POS (11/20 2876)  Rubella: Immune (05/23 0000)     Physical Exam:  General: alert, cooperative and no distress CV: Regular rate and rhythm Resp: clear Abdomen: soft, nontender, normal bowel sounds Incision: dry, intact and serous and < 30 % surface drainage present Uterine Fundus: firm, below umbilicus, nontender Lochia: minimal Ext: edema trace and no cords or calf tenderness      Assessment/Plan: 35 y.o.   POD# 2. [id number]                 Principal Problem:   Postpartum care following cesarean delivery (11/21) Active  Problems:   PIH (pregnancy induced hypertension)  - BP improved, mild range since yesterday, no neural s/s, diuresing well and labs stable this AM  DC Mag Sulfate now.  DC Labetalol Start Procardia XL 20 mg PO daily Encourage to ambulate Routine post-op care  Dr. Garwin Brothers updated w/ [place], CNM, MSN 05/15/2016, 10:29 AM

## 2016-05-15 NOTE — Progress Notes (Signed)
When went to check on BPs, pt was sleeping and machine had not recorded BPs despite husband saying that the cuff inflated 3 times.  At start of recording BP was 173/85.  At 17:05 was 153/84.  Derrell Lolling, CNM called for update and above information was relayed to her.  She stated to call back if patient has a severe range BP again (SBP >/= 160 or DBP >/= 110).  No new orders received.

## 2016-05-15 NOTE — Progress Notes (Signed)
Patient received 1st dose of Procardia at 13:08pm.  BP at 16:00 = 175/98 with dinamap.  Took again at 16:10 with manual cuff BP = 180/118.  Pt denies HA, visual disturbances, or epigastric pain.  DTRs 2+, no clonus.  Phoned Derrell Lolling, CNM with update.  She instructed RN to have patient do breathing exercises and set up the dinamap to record serial BPs q20minutes for a while, then phone CNM back with results.

## 2016-05-16 LAB — RPR: RPR: NONREACTIVE

## 2016-05-16 MED ORDER — NIFEDIPINE ER OSMOTIC RELEASE 30 MG PO TB24
60.0000 mg | ORAL_TABLET | Freq: Every day | ORAL | Status: DC
  Administered 2016-05-17: 60 mg via ORAL
  Filled 2016-05-16: qty 2

## 2016-05-16 MED ORDER — NIFEDIPINE ER OSMOTIC RELEASE 30 MG PO TB24
30.0000 mg | ORAL_TABLET | ORAL | Status: AC
  Administered 2016-05-16: 30 mg via ORAL
  Filled 2016-05-16: qty 1

## 2016-05-16 NOTE — Progress Notes (Signed)
Subjective: POD# 3 Visit completed in NICU while mom provides STS care to newborn.   Information for the patient's newborn:  [place], Bu[place]da Girl [place] [id number]  female  Baby name: Jarrett Soho  Reports feeling well, anxiety improved today. Feeding: breast Patient reports tolerating PO.  Breast symptoms: pumping. (+) colostrum Pain controlled with PO meds Denies HA/SOB/C/P/N/V/dizziness. Flatus present. She reports vaginal bleeding as normal, without clots.  She is ambulating, urinating without difficulty.     Objective:   VS:    Vitals:   05/16/16 0200 05/16/16 0602 05/16/16 0743 05/16/16 1154  BP: (!) 149/87 (!) 153/87 (!) 153/99 (!) 146/94  Pulse: 85 88 94 92  Resp: 17 18 16 16   Temp:  97.9 F (36.6 C) 98 F (36.7 C) 98.6 F (37 C)  TempSrc:  Oral Oral Oral  SpO2: 95% 97% 98%   Weight:      Height:         Intake/Output Summary (Last 24 hours) at 05/16/16 1353 Last data filed at 05/16/16 0700  Gross per 24 hour  Intake              970 ml  Output             5200 ml  Net            -4230 ml        Recent Labs  05/14/16 0529 05/15/16 0546  WBC 16.2* 15.3*  HGB 12.2 11.9*  HCT 34.5* 33.5*  PLT 218 244     Blood type: --/--/O POS (11/20 [id number])  Rubella: Immune (05/23 0000)     Physical Exam:  General: alert, cooperative and no distress Abdomen: soft, nontender, normal bowel sounds Incision: dry, intact and no new drainage stains Uterine Fundus: firm, below umbilicus, nontender Lochia: minimal Ext: edema +      Assessment/Plan: 35 y.o.   POD# 3. [id number]                 Principal Problem:   Postpartum care following cesarean delivery (11/21) Active Problems:   PIH (pregnancy induced hypertension)  - BP mild range on Procardia 30 XL, no neural S/S, and continues to diurese well of Mag Sulfate.   - increase Procardia to 60XL (will give additional 30mg  XL now, had today's dose of 30 at 10 AM)  Hx Anxiety/ depression, sx well controlled on Fluoxetine and  BuSpar Doing well, stable.    Routine post-op care Anticipate DC in AM  POC in consult with Dr. Madelaine Bhat, CNM, MSN 05/16/2016, 1:53 PM

## 2016-05-16 NOTE — Lactation Note (Signed)
This note was copied from a baby's chart. Lactation Consultation Note  Patient Name: Kara Murphy Today's Date: 05/16/2016   NICU baby 26 hours old. Mom reports that she has been pumping about every 4 hours and slept about 7 last night without pumping at all. Discussed supply and demand and the progression of milk coming to volume. Enc mom to pump every 2-3 hours for a total of at least 8 times/24 hours followed by hand expression. Enc mom to continue offering lots of STS and attempts at the breast with nuzzling/latching. Parents given paperwork for 2-week rental and are expecting D/C 05-17-16.   Maternal Data    Feeding Feeding Type: Breast Milk Length of feed: 30 min  LATCH Score/Interventions                      Lactation Tools Discussed/Used     Consult Status      Andres Labrum 05/16/2016, 2:59 PM

## 2016-05-16 NOTE — Plan of Care (Signed)
Problem: Activity: Goal: Will verbalize the importance of balancing activity with adequate rest periods Outcome: Completed/Met Date Met: 05/16/16 Emphasized adequate rest periods after activities of the day. Goal: Ability to tolerate increased activity will improve Outcome: Completed/Met Date Met: 05/16/16 Ambulating, ADL's with minimal assist from spouse.  Problem: Education: Goal: Knowledge of condition will improve Outcome: Progressing Improving condition and therapeutic regimen discussed again with patient and spouse.  Problem: Coping: Goal: Ability to cope will improve Outcome: Progressing Anxiety decreasing. Goal: Ability to identify and utilize available resources and services will improve Outcome: Completed/Met Date Met: 05/16/16 Available resources to meet needs identified.  Problem: Life Cycle: Goal: Risk for postpartum hemorrhage will decrease Outcome: Completed/Met Date Met: 05/16/16 Uterus firm and contracted. Bleeding scant. Goal: Chance of risk for complications during the postpartum period will decrease Outcome: Progressing Pre-eclampsia improving.  Problem: Role Relationship: Goal: Ability to demonstrate positive interaction with newborn will improve Outcome: Not Met (add Reason) BAby in NICU  Problem: Pain Management: Goal: General experience of comfort will improve and pain level will decrease Outcome: Completed/Met Date Met: 05/16/16 Good pain control with anaglesia of choice.  Problem: Bowel/Gastric: Goal: Gastrointestinal status will improve Outcome: Completed/Met Date Met: 05/16/16 Audible bowel sounds. Passing flatus. Tolerating solid food.  Problem: Respiratory: Goal: Ability to maintain adequate ventilation will improve Outcome: Completed/Met Date Met: 05/16/16 Unlabored breathing.  Lungs clear.  Problem: Urinary Elimination: Goal: Ability to reestablish a normal urinary elimination pattern will improve Outcome: Completed/Met Date Met:  05/16/16 Voiding without difficulty,. Adequate amount.  Problem: Anxiety Goal: Anxiety is at manageable level Outcome: Progressing Anxiety decreasing.  Identifies effective coping mechanisms

## 2016-05-16 NOTE — Clinical Social Work Maternal (Signed)
  CLINICAL SOCIAL WORK MATERNAL/CHILD NOTE  Patient Details  Name: Kara Murphy MRN: [id number] Date of Birth: [birth year]  Date:  05/16/2016  Clinical Social Worker Initiating Note:  Laurey Arrow Date/ Time Initiated:  05/16/16/1404     Child's Name:  Bayard Hugger   Legal Guardian:  Mother   Need for Interpreter:  None   Date of Referral:  05/16/16     Reason for Referral:  Behavioral Health Issues, including SI  (NICU admission)   Referral Source:  NICU   Address:  [address] [place] [address]  Phone number:  [phone]   Household Members:  Self, Significant Other   Natural Supports (not living in the home):  Immediate Family, Extended Family   Professional Supports: None (Referral was made to [facility] for counseling. )   Employment: Full-time   Type of Work: Hydrologist Professor   Education:  Financial controller Resources:  Multimedia programmer   Other Resources:      Cultural/Religious Considerations Which May Impact Care: None reported  Strengths:  Ability to meet basic needs , Engineer, materials , Understanding of illness, Home prepared for child    Risk Factors/Current Problems:  Mental Health Concerns    Cognitive State:  Alert , Able to Concentrate , Linear Thinking , Insightful , Goal Oriented    Mood/Affect:  Anxious , Bright , Happy , Interested , Comfortable    CSW Assessment: CSW met with MOB to complete an assessment for a consult for hx of anxiety.  MOB was inviting, polite, and interested in meeting with CSW.  MOB introduced her room guest as FOB/Husband Marcell Barlow). MOB gave CSW permission to speak with MOB in FOB's presence. CSW inquired about MOB's supports, and MOB stated that she and FOB resides together, and they have a wealth of support from immediate to extended family members. MOB expressed that she and FOB are excited about being new parents, and feel like they are well prepared for the baby. CSW  inquired about MOB's MH hx.  MOB acknowledged a hx of anxiety and communicated that MOB has been consistent with her medication regiment.  MOB reported that prior to pregnancy MOB was prescribed Flouxetine and since delivery, she has also been prescribed Buspar.  MOB reported having a panic attack the second day of MOB's hospital stay.  MOB described feelings of anxiousness, being afraid, and concerned.  CSW validated and normalized MOB's thoughts and feelings.  CSW also educated MOB about PPD. CSW informed MOB of possible supports and interventions to decrease PPD.  CSW also encouraged MOB to seek medical attention if needed for increased signs, symptoms for PPD. CSW offered MOB resources for outpatient MH counseling, and MOB was interested. CSW completed a referral for [facility], with clinician Azalee Course.  CSW reviewed safe sleep, and SIDS. MOB and FOB were knowledgeable and asked appropriate questions.  MOB communicated that she has a bassinet and crib for the baby, and has read information regarding SIDS. CSW provide MOB with CSW contact information and encouraged MOB to contact CSW with any additional questions or concerns.  CSW will follow-up with MOB weekly while infant is in NICU.    CSW Plan/Description:  Psychosocial Support and Ongoing Assessment of Needs, Information/Referral to [employer] , Patient/Family Education    Laurey Arrow, MSW, [employer] Social Work [phone]    Dimple Nanas, LCSW 05/16/2016, 2:07 PM

## 2016-05-17 ENCOUNTER — Encounter (HOSPITAL_COMMUNITY): Payer: Self-pay | Admitting: *Deleted

## 2016-05-17 LAB — CBC
HCT: 38.1 % (ref 36.0–46.0)
Hemoglobin: 13.4 g/dL (ref 12.0–15.0)
MCH: 30.7 pg (ref 26.0–34.0)
MCHC: 35.2 g/dL (ref 30.0–36.0)
MCV: 87.2 fL (ref 78.0–100.0)
PLATELETS: 278 10*3/uL (ref 150–400)
RBC: 4.37 MIL/uL (ref 3.87–5.11)
RDW: 13.4 % (ref 11.5–15.5)
WBC: 21.2 10*3/uL — AB (ref 4.0–10.5)

## 2016-05-17 LAB — COMPREHENSIVE METABOLIC PANEL
ALBUMIN: 2.7 g/dL — AB (ref 3.5–5.0)
ALK PHOS: 80 U/L (ref 38–126)
ALT: 21 U/L (ref 14–54)
AST: 31 U/L (ref 15–41)
Anion gap: 10 (ref 5–15)
BILIRUBIN TOTAL: 0.5 mg/dL (ref 0.3–1.2)
BUN: 17 mg/dL (ref 6–20)
CALCIUM: 8.4 mg/dL — AB (ref 8.9–10.3)
CO2: 21 mmol/L — ABNORMAL LOW (ref 22–32)
Chloride: 104 mmol/L (ref 101–111)
Creatinine, Ser: 0.71 mg/dL (ref 0.44–1.00)
GFR calc Af Amer: >60 mL/min (ref >60)
GLUCOSE: 87 mg/dL (ref 65–99)
POTASSIUM: 3.7 mmol/L (ref 3.5–5.1)
Sodium: 135 mmol/L (ref 135–145)
TOTAL PROTEIN: 6.1 g/dL — AB (ref 6.5–8.1)

## 2016-05-17 LAB — URIC ACID: URIC ACID, SERUM: 6.1 mg/dL (ref 2.3–6.6)

## 2016-05-17 MED ORDER — BUSPIRONE HCL 5 MG PO TABS: 5.0000 mg | ORAL_TABLET | Freq: Three times a day (TID) | ORAL | 2 refills | Status: DC

## 2016-05-17 MED ORDER — NIFEDIPINE ER 60 MG PO TB24: 60.0000 mg | ORAL_TABLET | Freq: Every day | ORAL | 3 refills | Status: DC

## 2016-05-17 MED ORDER — FLUOXETINE HCL 20 MG PO CAPS: 20.0000 mg | ORAL_CAPSULE | Freq: Two times a day (BID) | ORAL | 2 refills | Status: DC

## 2016-05-17 MED ORDER — BUSPIRONE HCL 5 MG PO TABS: 5.0000 mg | ORAL_TABLET | Freq: Three times a day (TID) | ORAL | 3 refills | Status: DC

## 2016-05-17 MED ORDER — NIFEDIPINE ER 60 MG PO TB24: 60.0000 mg | ORAL_TABLET | Freq: Every day | ORAL | 2 refills | Status: DC

## 2016-05-17 MED ORDER — FLUOXETINE HCL 20 MG PO CAPS: 20.0000 mg | ORAL_CAPSULE | Freq: Two times a day (BID) | ORAL | 3 refills | Status: DC

## 2016-05-17 MED ORDER — OXYCODONE-ACETAMINOPHEN 5-325 MG PO TABS: 1.0000 | ORAL_TABLET | ORAL | 0 refills | Status: DC | PRN

## 2016-05-17 NOTE — Lactation Note (Signed)
This note was copied from a baby's chart. Lactation Consultation Note  Patient Name: Kara Murphy Today's Date: 05/17/2016 Reason for consult: Follow-up assessment;Late preterm infant;NICU baby;Infant < 6lbs;Other (Comment) (mom post MagSo4 ) Baby is 69 hours old and in NICU. Per mom has been pumping both breast very 2-3 hours with the most volume =30 ml. Per mom  B/P still elevated and being D/C on B/P meds. LC stressed the importance of consistent pumping both breast @ least 8 x's a day and power Pump x 1 for 60 mins ( LC explained to mom ). LC also discussed the importance of resting and napping often due to elevated B/P.  Sore nipple  and engorgement prevention and tx reviewed. Mom rented a DE[employer]  Rondell Reams did the processing with I - payment ) with instructions. Mom receptive to teaching review.  Mother informed of post-discharge support and given phone number to the lactation department, including services for phone call assistance; out-patient appointments; and breastfeeding support group. List of other breastfeeding resources in the community given in the handout. Encouraged mother to call for problems or concerns related to breastfeeding.  Maternal Data Has patient been taught Hand Expression?: Yes  Feeding Feeding Type: Breast Milk Length of feed: 30 min  LATCH Score/Interventions                      Lactation Tools Discussed/Used Tools: Pump Breast pump type: Double-Electric Breast Pump   Consult Status Consult Status: PRN Follow-up type: In-patient (NICU )    Jerlyn Ly [facility] 05/17/2016, 12:58 PM

## 2016-05-17 NOTE — Discharge Instructions (Signed)
Postpartum Care After Cesarean Delivery °The period of time right after you deliver your newborn is called the postpartum period. °What kind of medical care will I receive? °· You may continue to receive fluids and medicines through an IV tube inserted into one of your veins. °· You may have small, flexible tube (catheter) draining urine from your bladder into a bag outside of your body. The catheter will be removed as soon as possible. °· You may be given a squirt bottle to use when you go to the bathroom. You may use this until you are comfortable wiping as usual. To use the squirt bottle, follow these steps: °¨ Before you urinate, fill the squirt bottle with warm water. The water should be warm. Do not use hot water. °¨ After you urinate, while you are sitting on the toilet, use the squirt bottle to rinse the area around your urethra and vaginal opening. This rinses away any urine and blood. °¨ You may do this instead of wiping. As you start healing, you may use the squirt bottle before wiping yourself. Make sure to wipe gently. °¨ Fill the squirt bottle with clean water every time you use the bathroom. °· You will be given sanitary pads to wear. °· Your incision will be monitored to make sure it is healing properly. You will be told when it is safe for your stitches, staples, or skin adhesive tape to be removed. °What can I expect? °· You may not feel the need to urinate for several hours after delivery. °· You will have some soreness and pain in your abdomen. You may have a small amount of blood or clear fluid coming from your incision. °· If you are breastfeeding, you may have uterine contractions every time you breastfeed for up to several weeks postpartum. Uterine contractions help your uterus return to its normal size. °· It is normal to have vaginal bleeding (lochia) after delivery. The amount and appearance of lochia is often similar to a menstrual period in the first week after delivery. It will  gradually decrease over the next few weeks to a dry, yellow-brown discharge. For most women, lochia stops completely by 6-8 weeks after delivery. Vaginal bleeding can vary from woman to woman. °· Within the first few days after delivery, you may have breast engorgement. This is when your breasts feel heavy, full, and uncomfortable. Your breasts may also throb and feel hard, tightly stretched, warm, and tender. After this occurs, you may have milk leaking from your breasts. Your health care provider can help you relieve discomfort due to breast engorgement. Breast engorgement should go away within a few days. °· You may feel more sad or worried than normal due to hormonal changes after delivery. These feelings should not last more than a few days. If these feelings do not go away after several days, speak with your health care provider. °How should I care for myself? °· Tell your health care provider if you have pain or discomfort. °· Drink enough water to keep your urine clear or pale yellow. °· Wash your hands thoroughly with soap and water for at least 20 seconds after changing your sanitary pads or using the toilet, and before holding or feeding your baby. °· If you are not breastfeeding, avoid touching your breasts a lot. Doing this can make your breasts produce more milk. °· If you become weak or lightheaded, or you feel like you might faint, ask for help before: °¨ Getting out of bed. °¨ Showering. °·   you become weak or lightheaded, or you feel like you might faint, ask for help before:  ? Getting out of bed.  ? Showering.  · Change your sanitary pads frequently. Watch for any changes in your flow, such as a sudden increase in volume, a change in color, or the passing of large blood clots. If you pass a blood clot from your vagina, save it to show to your health care provider. Do not flush blood clots down the toilet without having your health care provider look at them.  · Make sure that all your vaccinations are up to date. This can help protect you and your baby from getting certain diseases. You may need to have immunizations done  before you leave the hospital.  · If desired, talk with your health care provider about methods of family planning or birth control (contraception).  How can I start bonding with my baby?  Spending as much time as possible with your baby is very important. During this time, you and your baby can get to know each other and develop a bond. Having your baby stay with you in your room (rooming in) can give you time to get to know your baby. Rooming in can also help you become comfortable caring for your baby. Breastfeeding can also help you bond with your baby.  How can I plan for returning home with my baby?  · Make sure that you have a car seat installed in your vehicle.  ? Your car seat should be checked by a certified car seat installer to make sure that it is installed safely.  ? Make sure that your baby fits into the car seat safely.  · Ask your health care provider any questions you have about caring for yourself or your baby. Make sure that you are able to contact your health care provider with any questions after leaving the hospital.  This information is not intended to replace advice given to you by your health care provider. Make sure you discuss any questions you have with your health care provider.  Document Released: 03/03/2012 Document Revised: 11/12/2015 Document Reviewed: 05/14/2015  Elsevier Interactive Patient Education © 2017 [employer]

## 2016-05-17 NOTE — Progress Notes (Signed)
Postop day #4 status post primary cesarean section for preeclampsia severe range pressures, remote from delivery  Subjective: Patient notes occasionally has a very mild headache which resolves with rest. No constant headache. No scotomata. No right upper quadrant pain. No vaginal bleeding. Patient notes pain well controlled with by mouth meds. Patient has been and bleeding without difficulty. Patient notes positive lead, positive flatus, positive bowel movement. Appetite overall decreased but still eating well. Patient reports no chest pain or shortness of breath. Patient does note anxiety symptoms and states that whenever the nurse comments to take a blood pressure she can feel her tension increased significantly. Patient notes concerns over how long her blood pressure will stay elevated and is worried that she'll be needing to be on blood pressure medicines "for the rest of her life "baby is doing well in NICU. Mom is pumping with good milk production.  Objective Vitals:   05/16/16 2044 05/17/16 0645 05/17/16 1010 05/17/16 1043  BP: (!) 153/90 (!) 150/92 (!) 155/105 (!) 152/104  Pulse: 98 (!) 102 94   Resp:  16 18   Temp:  97.8 F (36.6 C) 98.8 F (37.1 C)   TempSrc:  Oral    SpO2:  97%    Weight:      Height:       Gen.: Anxious but no distress Cardiovascular: Regular rate and rhythm Pulmonary: Clear to auscultation bilaterally Back: No costovertebral angle tenderness Abdomen: No right upper quadrant pain, properly tender, nondistended, no rebound, no guarding, fundus below the umbilicus, appropriately tender Lower extremity: No edema, 2+ DTR, no clonus GU: Minimal staining on pad Skin: Incision clean dry dressed  Assessment and plan: 35 year old G1 P1. 4 days after primary cesarean section for preeclampsia with severe features remote from delivery. - Anxiety. This appears to the patient's biggest problem. I believe her anxiety is what is driving her hypertension rather than continued  severe hypertension/preeclampsia. Will increase her fluoxetine to 20 twice a day and continue the BuSpar. Will encourage patient to set up psychiatric follow-up. I think discharge from the hospital will help her anxiety and we will certainly see decrease in her blood pressure. - Hypertension/preeclampsia. Patient was only meeting severe preeclampsia criteria by her blood pressure. Now that she is 4 days post delivery and has completed her course of magnesium, I believe for continued blood pressure elevations to be due to her anxiety. We have increased her Procardia to 60 XL but her blood pressures have been relatively unchanged. Patient no longer has symptoms of preeclampsia with headache or scotomata. Will repeat labs for preeclampsia today and these are stable will plan to discharge the patient to home. Patient is aware that we are discharging her with elevated blood pressures and that remains risks of seizures and worsening hypertension and preeclampsia. However the clinical diagnosis remains anxiety related hypertensive changes. Will follow her closely with repeat blood pressures on Monday. Patient also will be back-and-forth to the NICU and have options for closer observation. Patient agrees to this strategy and the risks and benefits.  Perle Gibbon A. 05/17/2016 11:35 AM

## 2016-05-18 NOTE — Discharge Summary (Signed)
OB Discharge Summary  Patient Name: Kara Murphy DOB: 1980-12-28 MRN: [id number]  Date of admission: 05/09/2016 Delivering MD: Aloha Gell   Date of discharge: 05/07/16  Admitting diagnosis: 34WKS, PIH Intrauterine pregnancy: [redacted]w[redacted]d     Secondary diagnosis:Principal Problem:   Postpartum care following cesarean delivery (11/21) Active Problems:   PIH (pregnancy induced hypertension)  Additional problems:anxiety     Discharge diagnosis: Preeclampsia (severe)                                                                     Post partum procedures:none  Augmentation: none  Complications: None  Hospital course:  Sceduled C/S   35 y.o. yo G1P0 at [redacted]w[redacted]d admitted for PEC, labs stable but bp's remained elevated. Pt did BMZ and several doses of IV antihypertensives. Difficult to distinguish her elevated bps due to anxiety or PEC. At 35 wks, decision made to proceed to [facility].   Membrane Rupture Time/Date:   ,05/13/2016   Patient delivered a severe PEC, remote from delivery, patient elected PCS over IOL infant.05/13/2016  Details of operation can be found in separate operative note.  24 hrs PP Mag. Pateint had a postpartum course complicated by significant anxiety. BPs remained elevated but no other si/sx/ exam findings or labs c/w PEC. Clinical suspicion was that elevated bps were due to anxiety, which would be relieved by d/c to home. Pt was allowed d/c with plan for f/u in the office in 2 days time, pt aware of sx of PEC/ htn and will report sooner with any sx.   She is ambulating, tolerating a regular diet, passing flatus, and urinating well. Patient is discharged home in stable condition on  05/18/16          Physical exam Vitals:   05/17/16 0645 05/17/16 1010 05/17/16 1043 05/17/16 1305  BP: (!) 150/92 (!) 155/105 (!) 152/104 (!) 155/99  Pulse: (!) 102 94    Resp: 16 18    Temp: 97.8 F (36.6 C) 98.8 F (37.1 C)    TempSrc: Oral     SpO2: 97%     Weight:       Height:       General: alert Lochia: appropriate Uterine Fundus: firm Incision: Healing well with no significant drainage DVT Evaluation: No evidence of DVT seen on physical exam. Labs: Lab Results  Component Value Date   WBC 21.2 (H) 05/17/2016   HGB 13.4 05/17/2016   HCT 38.1 05/17/2016   MCV 87.2 05/17/2016   PLT 278 05/17/2016   CMP Latest Ref Rng & Units 05/17/2016  Glucose 65 - 99 mg/dL 87  BUN 6 - 20 mg/dL 17  Creatinine 0.44 - 1.00 mg/dL 0.71  Sodium 135 - 145 mmol/L 135  Potassium 3.5 - 5.1 mmol/L 3.7  Chloride 101 - 111 mmol/L 104  CO2 22 - 32 mmol/L 21(L)  Calcium 8.9 - 10.3 mg/dL 8.4(L)  Total Protein 6.5 - 8.1 g/dL 6.1(L)  Total Bilirubin 0.3 - 1.2 mg/dL 0.5  Alkaline Phos 38 - 126 U/L 80  AST 15 - 41 U/L 31  ALT 14 - 54 U/L 21    Discharge instruction: per After Visit Summary and "Baby and Me Booklet".  After Visit Meds:  Medication List    TAKE these medications   busPIRone 5 MG tablet Commonly known as:  BUSPAR Take 1 tablet (5 mg total) by mouth 3 (three) times daily.   FLUoxetine 20 MG capsule Commonly known as:  PROZAC Take 1 capsule (20 mg total) by mouth 2 (two) times daily. What changed:  when to take this   NIFEdipine 60 MG 24 hr tablet Commonly known as:  PROCARDIA-XL/ADALAT CC Take 1 tablet (60 mg total) by mouth daily.   oxyCODONE-acetaminophen 5-325 MG tablet Commonly known as:  PERCOCET/ROXICET Take 1-2 tablets by mouth every 4 (four) hours as needed (pain scale 4-7).   prenatal multivitamin Tabs tablet Take 1 tablet by mouth daily at 12 noon.       Diet: routine diet  Activity: Advance as tolerated. Pelvic rest for 6 weeks.   Outpatient follow up:2days Follow up Appt:No future appointments. Follow up visit: No Follow-up on file.  Postpartum contraception: Not Discussed  Newborn Data: Live born female  Birth Weight: 3 lb 15.1 oz (1790 g) APGAR: 8, 9  Baby Feeding: Bottle and  Breast Disposition:NICU   05/18/2016 Ala Dach., MD

## 2016-05-19 DIAGNOSIS — O135 Gestational [pregnancy-induced] hypertension without significant proteinuria, complicating the puerperium: Secondary | ICD-10-CM | POA: Diagnosis not present

## 2016-05-22 ENCOUNTER — Telehealth (HOSPITAL_COMMUNITY): Payer: Self-pay | Admitting: Lactation Services

## 2016-05-22 ENCOUNTER — Ambulatory Visit: Payer: Self-pay

## 2016-05-22 NOTE — Telephone Encounter (Signed)
Mom requesting assistance with latching baby on 05-23-16.

## 2016-05-22 NOTE — Lactation Note (Signed)
This note was copied from a baby's chart. Lactation Consultation Note  Patient Name: Girl [place] Today's Date: 05/22/2016   NICU baby 59 days old. Discussed with bedside RN, Elmyra Ricks, that mom is wanting to latch baby the following day. Enc passing the information to the following nurse in report.   Maternal Data    Feeding Feeding Type: Breast Milk Length of feed: 30 min  LATCH Score/Interventions                      Lactation Tools Discussed/Used     Consult Status      Andres Labrum 05/22/2016, 4:17 PM

## 2016-05-23 ENCOUNTER — Ambulatory Visit: Payer: Self-pay

## 2016-05-23 NOTE — Lactation Note (Signed)
This note was copied from a baby's chart. Lactation Consultation Note  Patient Name: Girl [place] Today's Date: 05/23/2016 Reason for consult: Follow-up assessment;NICU baby  NICU baby 98 days old. Mom reports that baby has nuzzled, but not really latched. Mom also stated that she has not pumped in several hours. Mom's breast are very full, but still compressible. Discussed with mom the advantage of pumping prior to latching when she is first offering the breast to "Orvil Feil," especially since Orvil Feil is having some difficulty taking the bottle as well. After several attempts at latching, fitted mom with a #20 NS and baby did have a couple of bursts of suckles and the NS was full of breast milk when [place] came off. Enc mom to keep Hanna tucked in tight while nursing.   Mom reports that she is only pumping 7 times/24 hours. Discussed ways of working in another pumping session. Enc mom to pump before she leaves the house to come to the hospital--even if it has been only one hours since last pumping time. Enc mom to continue to put Orvil Feil to breast as she and Orvil Feil are able to practice. Discussed assessment and interventions with patient's bedside RN, Elmyra Ricks.  Maternal Data    Feeding Feeding Type: Breast Milk Nipple Type: Slow - flow Length of feed: 0 min  LATCH Score/Interventions Latch: Grasps breast easily, tongue down, lips flanged, rhythmical sucking.  Audible Swallowing: A few with stimulation  Type of Nipple: Everted at rest and after stimulation  Comfort (Breast/Nipple): Soft / non-tender     Hold (Positioning): Assistance needed to correctly position infant at breast and maintain latch. Intervention(s): Breastfeeding basics reviewed;Position options;Skin to skin  LATCH Score: 8  Lactation Tools Discussed/Used Tools: Nipple Shields Nipple shield size: 20   Consult Status Consult Status: PRN    Andres Labrum 05/23/2016, 11:20 AM

## 2016-05-28 ENCOUNTER — Ambulatory Visit: Payer: Self-pay

## 2016-05-28 NOTE — Lactation Note (Signed)
This note was copied from a baby's chart. Lactation Consultation Note  Patient Name: Kara Murphy Today's Date: 05/28/2016  Follow up visit made prior to baby's discharge.  Parents roomed in with baby last night.  Mom is pumping 8 times in 24 hours and obtaining 60+ mls from each breast.  C/o sore nipples.  She is using the 21 mm flange.  Recommended she change to 24 mm and also use coconut oil inside flange.  Baby nuzzles at breast but not much interest in latching.  Encouraged mom to call for an outpatient appointment 1-2 weeks after discharge to work on breastfeeding.   Maternal Data    Feeding    LATCH Score/Interventions                      Lactation Tools Discussed/Used     Consult Status      Ave Filter 05/28/2016, 11:26 AM

## 2016-05-28 NOTE — Lactation Note (Signed)
This note was copied from a baby's chart. Lactation Consultation Note  Patient Name: Kara Murphy Today's Date: 05/28/2016     Maternal Data    Feeding    LATCH Score/Interventions                      Lactation Tools Discussed/Used     Consult Status      Ave Filter 05/28/2016, 11:31 AM

## 2016-06-25 DIAGNOSIS — Z1151 Encounter for screening for human papillomavirus (HPV): Secondary | ICD-10-CM | POA: Diagnosis not present

## 2016-06-25 DIAGNOSIS — Z113 Encounter for screening for infections with a predominantly sexual mode of transmission: Secondary | ICD-10-CM | POA: Diagnosis not present

## 2016-07-04 DIAGNOSIS — Z30430 Encounter for insertion of intrauterine contraceptive device: Secondary | ICD-10-CM | POA: Diagnosis not present

## 2016-07-04 DIAGNOSIS — Z3202 Encounter for pregnancy test, result negative: Secondary | ICD-10-CM | POA: Diagnosis not present

## 2016-08-04 DIAGNOSIS — Z30431 Encounter for routine checking of intrauterine contraceptive device: Secondary | ICD-10-CM | POA: Diagnosis not present

## 2017-06-30 DIAGNOSIS — Z6825 Body mass index (BMI) 25.0-25.9, adult: Secondary | ICD-10-CM | POA: Diagnosis not present

## 2017-06-30 DIAGNOSIS — Z23 Encounter for immunization: Secondary | ICD-10-CM | POA: Diagnosis not present

## 2017-06-30 DIAGNOSIS — Z01419 Encounter for gynecological examination (general) (routine) without abnormal findings: Secondary | ICD-10-CM | POA: Diagnosis not present

## 2017-07-09 DIAGNOSIS — Z131 Encounter for screening for diabetes mellitus: Secondary | ICD-10-CM | POA: Diagnosis not present

## 2017-07-09 DIAGNOSIS — Z13220 Encounter for screening for lipoid disorders: Secondary | ICD-10-CM | POA: Diagnosis not present

## 2017-07-09 DIAGNOSIS — Z1329 Encounter for screening for other suspected endocrine disorder: Secondary | ICD-10-CM | POA: Diagnosis not present

## 2017-07-09 DIAGNOSIS — Z0000 Encounter for general adult medical examination without abnormal findings: Secondary | ICD-10-CM | POA: Diagnosis not present

## 2017-11-02 ENCOUNTER — Encounter (HOSPITAL_COMMUNITY): Payer: Self-pay | Admitting: Emergency Medicine

## 2017-11-02 ENCOUNTER — Emergency Department (HOSPITAL_COMMUNITY)
Admission: EM | Admit: 2017-11-02 | Discharge: 2017-11-02 | Disposition: A | Discharge status: Home / Self Care | Payer: BLUE CROSS/BLUE SHIELD | Attending: Emergency Medicine | Admitting: Emergency Medicine

## 2017-11-02 DIAGNOSIS — R1030 Lower abdominal pain, unspecified: Secondary | ICD-10-CM | POA: Insufficient documentation

## 2017-11-02 DIAGNOSIS — Z5321 Procedure and treatment not carried out due to patient leaving prior to being seen by health care provider: Secondary | ICD-10-CM | POA: Insufficient documentation

## 2017-11-02 NOTE — ED Notes (Signed)
Pt gave stickers back to registration and told them they are leaving.

## 2017-11-02 NOTE — ED Triage Notes (Signed)
Pt reports this morning after having BM started having severe lower abd pains that radiate to RLQ. Reports laid down and have since eased up. Denies any n/v/d or urinary problems. Pt had c section 18 months ago and did have pre Eclampsia with pregnancy.

## 2017-11-30 DIAGNOSIS — R1031 Right lower quadrant pain: Secondary | ICD-10-CM | POA: Diagnosis not present

## 2017-11-30 DIAGNOSIS — N760 Acute vaginitis: Secondary | ICD-10-CM | POA: Diagnosis not present

## 2017-11-30 DIAGNOSIS — K649 Unspecified hemorrhoids: Secondary | ICD-10-CM | POA: Diagnosis not present

## 2017-12-22 DIAGNOSIS — R1031 Right lower quadrant pain: Secondary | ICD-10-CM | POA: Diagnosis not present

## 2018-05-14 ENCOUNTER — Encounter: Payer: Self-pay | Admitting: Family Medicine

## 2018-05-14 ENCOUNTER — Ambulatory Visit: Payer: BLUE CROSS/BLUE SHIELD | Admitting: Family Medicine

## 2018-05-14 VITALS — BP 120/72 | HR 78 | Temp 97.7°F | Ht 63.0 in | Wt 142.0 lb

## 2018-05-14 DIAGNOSIS — Z0000 Encounter for general adult medical examination without abnormal findings: Secondary | ICD-10-CM

## 2018-05-14 DIAGNOSIS — Z23 Encounter for immunization: Secondary | ICD-10-CM | POA: Diagnosis not present

## 2018-05-14 DIAGNOSIS — F411 Generalized anxiety disorder: Secondary | ICD-10-CM | POA: Insufficient documentation

## 2018-05-14 DIAGNOSIS — F419 Anxiety disorder, unspecified: Secondary | ICD-10-CM

## 2018-05-14 DIAGNOSIS — H6122 Impacted cerumen, left ear: Secondary | ICD-10-CM | POA: Diagnosis not present

## 2018-05-14 DIAGNOSIS — Z0001 Encounter for general adult medical examination with abnormal findings: Secondary | ICD-10-CM | POA: Diagnosis not present

## 2018-05-14 LAB — COMPREHENSIVE METABOLIC PANEL
ALT: 12 U/L (ref 0–35)
AST: 12 U/L (ref 0–37)
Albumin: 4.7 g/dL (ref 3.5–5.2)
Alkaline Phosphatase: 47 U/L (ref 39–117)
BUN: 15 mg/dL (ref 6–23)
CHLORIDE: 103 meq/L (ref 96–112)
CO2: 27 meq/L (ref 19–32)
Calcium: 9.7 mg/dL (ref 8.4–10.5)
Creatinine, Ser: 0.65 mg/dL (ref 0.40–1.20)
GFR: 108.49 mL/min (ref >60.00)
GLUCOSE: 86 mg/dL (ref 70–99)
POTASSIUM: 4.1 meq/L (ref 3.5–5.1)
SODIUM: 140 meq/L (ref 135–145)
Total Bilirubin: 0.6 mg/dL (ref 0.2–1.2)
Total Protein: 7.3 g/dL (ref 6.0–8.3)

## 2018-05-14 LAB — CBC WITH DIFFERENTIAL/PLATELET
BASOS PCT: 0.6 % (ref 0.0–3.0)
Basophils Absolute: 0 10*3/uL (ref 0.0–0.1)
EOS ABS: 0 10*3/uL (ref 0.0–0.7)
Eosinophils Relative: 0.5 % (ref 0.0–5.0)
HCT: 43.5 % (ref 36.0–46.0)
HEMOGLOBIN: 14.8 g/dL (ref 12.0–15.0)
Lymphocytes Relative: 38.7 % (ref 12.0–46.0)
Lymphs Abs: 2.8 10*3/uL (ref 0.7–4.0)
MCHC: 34.1 g/dL (ref 30.0–36.0)
MCV: 90.8 fl (ref 78.0–100.0)
MONO ABS: 0.5 10*3/uL (ref 0.1–1.0)
Monocytes Relative: 7 % (ref 3.0–12.0)
NEUTROS PCT: 53.2 % (ref 43.0–77.0)
Neutro Abs: 3.9 10*3/uL (ref 1.4–7.7)
PLATELETS: 300 10*3/uL (ref 150.0–400.0)
RBC: 4.79 Mil/uL (ref 3.87–5.11)
RDW: 12.7 % (ref 11.5–15.5)
WBC: 7.3 10*3/uL (ref 4.0–10.5)

## 2018-05-14 LAB — LIPID PANEL
CHOL/HDL RATIO: 4
Cholesterol: 220 mg/dL — ABNORMAL HIGH (ref 0–200)
HDL: 59.9 mg/dL (ref >39.00)
LDL CALC: 132 mg/dL — AB (ref 0–99)
NONHDL: 160.39
Triglycerides: 141 mg/dL (ref 0.0–149.0)
VLDL: 28.2 mg/dL (ref 0.0–40.0)

## 2018-05-14 LAB — TSH: TSH: 1.84 u[IU]/mL (ref 0.35–4.50)

## 2018-05-14 NOTE — Progress Notes (Signed)
Patient: Kara Murphy MRN: [id number] DOB: 06/04/1981 PCP: Orma Flaming, MD     Subjective:  Chief Complaint  Patient presents with  . Establish Care    HPI: The patient is a 37 y.o. female who presents today for annual exam. She denies any changes to past medical history. There have been no recent hospitalizations. They are following a well balanced diet and exercise plan. Weight has been stable. No complaints today. She has a history of anxiety.   Anxiety: she states it comes and goes. She states the last 2 years it has been very manageable. She feels like every 3-4 years she starts to have anxiety. She has had anxiety since she was 37 years of age. She is currently on fluoxetine. She is on 20mg . She has been on 40mg  in the past. She has been on zoloft and lexapro in the past and she did not do well with these. (lexapro made her suicidal). She really likes prozac. She feels like she may need to increase her dosage at this point. She has also been on buspar and wasn't on this long (in hospital with daughter).   Hx of depression in the past, but feels like this has not been an issue over the past years and was more situational.   Immunization History  Administered Date(s) Administered  . Influenza,inj,Quad PF,6+ Mos 05/14/2018     Colonoscopy: routine screening  Mammogram: age 21. No first degree relative with history  Pap smear: 10/10/2015 Tdap: 02/06/2016 Hiv: done.  Flu: today    Review of Systems  Constitutional: Negative for chills, fatigue and fever.  HENT: Negative for dental problem, ear pain, hearing loss and trouble swallowing.   Eyes: Negative for visual disturbance.  Respiratory: Negative for cough, chest tightness and shortness of breath.   Cardiovascular: Negative for chest pain, palpitations and leg swelling.  Gastrointestinal: Positive for diarrhea. Negative for abdominal pain, blood in stool, constipation and nausea.  Endocrine: Negative for cold intolerance,  polydipsia, polyphagia and polyuria.  Genitourinary: Negative for dysuria and hematuria.  Musculoskeletal: Positive for neck pain. Negative for arthralgias and back pain.  Skin: Negative.  Negative for rash.  Neurological: Negative for dizziness and headaches.  Psychiatric/Behavioral: Negative for dysphoric mood and sleep disturbance. The patient is nervous/anxious.     Allergies Patient has No Known Allergies.  Past Medical History Patient  has a past medical history of Anxiety and Depression.  Surgical History Patient  has a past surgical history that includes Cesarean section (N/A, 05/13/2016) and Knee surgery (Right, 1999).  Family History Pateint's family history is not on file.  Social History Patient  reports that she has quit smoking. She has quit using smokeless tobacco. She reports that she drinks alcohol.    Objective: Vitals:   05/14/18 1044  BP: 120/72  Pulse: 78  Temp: 97.7 F (36.5 C)  TempSrc: Oral  SpO2: 97%  Weight: 142 lb (64.4 kg)  Height: 5\' 3"  (1.6 m)    Body mass index is 25.15 kg/m.  Physical Exam  Constitutional: She is oriented to person, place, and time. She appears well-developed and well-nourished.  HENT:  Right Ear: External ear normal.  Left Ear: External ear normal.  Mouth/Throat: Oropharynx is clear and moist.  Right TM wnl. Left TM impacted with cerumen.   Eyes: Pupils are equal, round, and reactive to light. Conjunctivae and EOM are normal.  Neck: Normal range of motion. Neck supple. No thyromegaly present.  Cardiovascular: Normal rate, regular rhythm, normal heart sounds  and intact distal pulses.  No murmur heard. Pulmonary/Chest: Effort normal and breath sounds normal.  Abdominal: Soft. Bowel sounds are normal. She exhibits no distension. There is no tenderness.  Lymphadenopathy:    She has no cervical adenopathy.  Neurological: She is alert and oriented to person, place, and time. She displays normal reflexes. No cranial  nerve deficit. Coordination normal.  Skin: Skin is warm and dry. No rash noted.  Psychiatric: She has a normal mood and affect. Her behavior is normal.  Vitals reviewed.  Ceruminosis is noted.  Wax is removed by syringing and manual debridement. Instructions for home care to prevent wax buildup are given.     GAD 7 : Generalized Anxiety Score 05/14/2018  Nervous, Anxious, on Edge 1  Control/stop worrying 1  Worry too much - different things 0  Trouble relaxing 1  Restless 0  Easily annoyed or irritable 1  Afraid - awful might happen 2  Total GAD 7 Score 6  Anxiety Difficulty Somewhat difficult    Depression screen PHQ 2/9 05/14/2018  Decreased Interest 1  Down, Depressed, Hopeless 0  PHQ - 2 Score 1  Altered sleeping 0  Tired, decreased energy 0  Change in appetite 0  Feeling bad or failure about yourself  0  Trouble concentrating 0  Moving slowly or fidgety/restless 0  Suicidal thoughts 0  PHQ-9 Score 1  Difficult doing work/chores Somewhat difficult     Assessment/plan: 1. Annual physical exam Routine lab work today. Flu shot today. utd on her other HM. Continue with exercise/diet. F/u in one year or as needed.  Patient counseling [x]    Nutrition: Stressed importance of moderation in sodium/caffeine intake, saturated fat and cholesterol, caloric balance, sufficient intake of fresh fruits, vegetables, fiber, calcium, iron, and 1 mg of folate supplement per day (for females capable of pregnancy).  [x]    Stressed the importance of regular exercise.   []    Substance Abuse: Discussed cessation/primary prevention of tobacco, alcohol, or other drug use; driving or other dangerous activities under the influence; availability of treatment for abuse.   [x]    Injury prevention: Discussed safety belts, safety helmets, smoke detector, smoking near bedding or upholstery.   [x]    Sexuality: Discussed sexually transmitted diseases, partner selection, use of condoms, avoidance of  unintended pregnancy  and contraceptive alternatives.  [x]    Dental health: Discussed importance of regular tooth brushing, flossing, and dental visits.  [x]    Health maintenance and immunizations reviewed. Please refer to Health maintenance section.    - CBC with Differential/Platelet - Comprehensive metabolic panel - Lipid panel - TSH  2. Need for prophylactic vaccination and inoculation against influenza  - Flu Vaccine QUAD 6+ mos PF IM (Fluarix Quad PF)  3. Anxiety Prozac filled by Dr. Pamala Hurry recently. We are going ot increase her to 20mg  BID since her anxiety seems to be worse despite GAD7 score being mild. She feels like her anxiety is worse. Can go back down to 20mg  once she feels like she can tolerate this dosage. She is not to stop medication cold [place]. She denies any panic attacks and is happy with the medication. F/u in 6-12 months or as needed if symptoms do not seem to be controlled.   4. Cerumen impaction -left ear lavaged and cerumen removed by nurse. Tolerated well.   Return in about 1 year (around 05/15/2019), or if symptoms worsen or fail to improve.   Orma Flaming, MD [place]  05/14/2018

## 2018-09-01 DIAGNOSIS — Z30432 Encounter for removal of intrauterine contraceptive device: Secondary | ICD-10-CM | POA: Diagnosis not present

## 2018-09-01 DIAGNOSIS — Z6825 Body mass index (BMI) 25.0-25.9, adult: Secondary | ICD-10-CM | POA: Diagnosis not present

## 2018-09-01 DIAGNOSIS — Z01419 Encounter for gynecological examination (general) (routine) without abnormal findings: Secondary | ICD-10-CM | POA: Diagnosis not present

## 2018-09-01 DIAGNOSIS — N898 Other specified noninflammatory disorders of vagina: Secondary | ICD-10-CM | POA: Diagnosis not present

## 2018-10-25 DIAGNOSIS — Z3200 Encounter for pregnancy test, result unknown: Secondary | ICD-10-CM | POA: Diagnosis not present

## 2018-10-25 DIAGNOSIS — Z3A01 Less than 8 weeks gestation of pregnancy: Secondary | ICD-10-CM | POA: Diagnosis not present

## 2018-10-25 DIAGNOSIS — O09521 Supervision of elderly multigravida, first trimester: Secondary | ICD-10-CM | POA: Diagnosis not present

## 2018-11-12 DIAGNOSIS — Z3201 Encounter for pregnancy test, result positive: Secondary | ICD-10-CM | POA: Diagnosis not present

## 2018-12-01 DIAGNOSIS — O09521 Supervision of elderly multigravida, first trimester: Secondary | ICD-10-CM | POA: Diagnosis not present

## 2018-12-01 DIAGNOSIS — Z113 Encounter for screening for infections with a predominantly sexual mode of transmission: Secondary | ICD-10-CM | POA: Diagnosis not present

## 2018-12-01 DIAGNOSIS — Z3A10 10 weeks gestation of pregnancy: Secondary | ICD-10-CM | POA: Diagnosis not present

## 2018-12-01 DIAGNOSIS — Z3689 Encounter for other specified antenatal screening: Secondary | ICD-10-CM | POA: Diagnosis not present

## 2018-12-01 DIAGNOSIS — Z118 Encounter for screening for other infectious and parasitic diseases: Secondary | ICD-10-CM | POA: Diagnosis not present

## 2018-12-01 DIAGNOSIS — O09299 Supervision of pregnancy with other poor reproductive or obstetric history, unspecified trimester: Secondary | ICD-10-CM | POA: Diagnosis not present

## 2018-12-01 LAB — OB RESULTS CONSOLE HIV ANTIBODY (ROUTINE TESTING): HIV: NONREACTIVE

## 2018-12-01 LAB — OB RESULTS CONSOLE ANTIBODY SCREEN: Antibody Screen: NEGATIVE

## 2018-12-01 LAB — OB RESULTS CONSOLE ABO/RH: RH Type: POSITIVE

## 2018-12-01 LAB — OB RESULTS CONSOLE RUBELLA ANTIBODY, IGM: Rubella: IMMUNE

## 2018-12-01 LAB — OB RESULTS CONSOLE GC/CHLAMYDIA
Chlamydia: NEGATIVE
Gonorrhea: NEGATIVE

## 2018-12-01 LAB — OB RESULTS CONSOLE HEPATITIS B SURFACE ANTIGEN: Hepatitis B Surface Ag: NEGATIVE

## 2018-12-01 LAB — OB RESULTS CONSOLE RPR: RPR: NONREACTIVE

## 2018-12-27 DIAGNOSIS — Z3A13 13 weeks gestation of pregnancy: Secondary | ICD-10-CM | POA: Diagnosis not present

## 2018-12-27 DIAGNOSIS — O09521 Supervision of elderly multigravida, first trimester: Secondary | ICD-10-CM | POA: Diagnosis not present

## 2019-01-17 DIAGNOSIS — Z361 Encounter for antenatal screening for raised alphafetoprotein level: Secondary | ICD-10-CM | POA: Diagnosis not present

## 2019-01-17 DIAGNOSIS — Z3A16 16 weeks gestation of pregnancy: Secondary | ICD-10-CM | POA: Diagnosis not present

## 2019-01-17 DIAGNOSIS — O09521 Supervision of elderly multigravida, first trimester: Secondary | ICD-10-CM | POA: Diagnosis not present

## 2019-01-20 DIAGNOSIS — F411 Generalized anxiety disorder: Secondary | ICD-10-CM | POA: Diagnosis not present

## 2019-02-03 DIAGNOSIS — F411 Generalized anxiety disorder: Secondary | ICD-10-CM | POA: Diagnosis not present

## 2019-02-07 DIAGNOSIS — Z3A19 19 weeks gestation of pregnancy: Secondary | ICD-10-CM | POA: Diagnosis not present

## 2019-02-07 DIAGNOSIS — O09522 Supervision of elderly multigravida, second trimester: Secondary | ICD-10-CM | POA: Diagnosis not present

## 2019-02-17 DIAGNOSIS — F411 Generalized anxiety disorder: Secondary | ICD-10-CM | POA: Diagnosis not present

## 2019-03-02 ENCOUNTER — Encounter: Payer: Self-pay | Admitting: Family Medicine

## 2019-03-02 ENCOUNTER — Other Ambulatory Visit: Payer: Self-pay

## 2019-03-02 ENCOUNTER — Ambulatory Visit (INDEPENDENT_AMBULATORY_CARE_PROVIDER_SITE_OTHER): Payer: BC Managed Care – PPO | Admitting: Family Medicine

## 2019-03-02 ENCOUNTER — Other Ambulatory Visit (HOSPITAL_COMMUNITY)
Admission: RE | Admit: 2019-03-02 | Discharge: 2019-03-02 | Disposition: A | Discharge status: Home / Self Care | Payer: BC Managed Care – PPO | Source: Ambulatory Visit | Attending: Family Medicine | Admitting: Family Medicine

## 2019-03-02 VITALS — BP 116/76 | HR 76 | Temp 98.0°F | Ht 63.0 in | Wt 159.4 lb

## 2019-03-02 DIAGNOSIS — D229 Melanocytic nevi, unspecified: Secondary | ICD-10-CM

## 2019-03-02 DIAGNOSIS — B078 Other viral warts: Secondary | ICD-10-CM | POA: Diagnosis not present

## 2019-03-02 NOTE — Progress Notes (Signed)
Patient: Kara Murphy MRN: [id number] DOB: 1980/09/10 PCP: Orma Flaming, MD     Subjective:  Chief Complaint  Patient presents with  . ? mole on R neck    HPI: The patient is a 39 y.o. female who presents today for abnormal mole on neck. She first noticed this on Thursday of last week. She states it was red around the skin and it looked like the mole was going to fall off. Over the weekend it turned black and shriveled up. The mole itself has been there "as long as she can remember." it didn't get bigger, but actually got smaller. No itching or side effects. She has not put anything on it. She is pregnant (23 weeks with a boy)   Review of Systems  Constitutional: Negative for fatigue and fever.  Skin: Positive for color change.       R neck-poss mole that has changed colors and sometimes gets red    Allergies Patient has No Known Allergies.  Past Medical History Patient  has a past medical history of Anxiety, Depression, Genital warts, History of chicken pox, History of fainting spells of unknown cause, Hypertension, and Screening for HIV (human immunodeficiency virus).  Surgical History Patient  has a past surgical history that includes Cesarean section (N/A, 05/13/2016) and Knee surgery (Right, 1999).  Family History Pateint's family history includes Alzheimer's disease in her maternal grandmother; Arthritis in her maternal grandfather and maternal grandmother; Cancer in her paternal grandfather and paternal grandmother; Depression in her father and mother; Drug abuse in her father; Heart attack in her paternal grandfather; Heart disease in her father; Hypertension in her mother; Mental illness in her father; Mental retardation in her father and sister; Stroke in her maternal grandfather.  Social History Patient  reports that she has quit smoking. Her smoking use included cigarettes. She has never used smokeless tobacco. She reports current alcohol use. She reports that she  does not use drugs.    Objective: Vitals:   03/02/19 0952  BP: 116/76  Pulse: 76  Temp: 98 F (36.7 C)  TempSrc: Skin  SpO2: 97%  Weight: 159 lb 6.4 oz (72.3 kg)  Height: 5\' 3"  (1.6 m)    Body mass index is 28.24 kg/m.  Physical Exam Vitals signs reviewed.  Constitutional:      Appearance: Normal appearance.  HENT:     Head: Normocephalic and atraumatic.  Skin:    Comments: Right neck: appears to be skin tag that is pedunculated and bottom half is black. Top is still flesh colored. No surrounding erythema, or abnormal finding besides blackened color.   Neurological:     Mental Status: She is alert.    Verbal consent obtained. Risks discussed. Mole excised with scissors and sent to pathology. Pressure applied and drysol to achieve hemostasis. Area dressed and pressure bandage applied. Wound care and bleeding discussed.     Assessment/plan: 1. Atypical mole Appears to be a skin tag that has lost vascular supply and died. With her hx of dysplastic nevus and her anxiety over this, will send to pathology. Wound care and bleeding discussed. If continues to bleed after 20 minutes of continuous pressure she is to return or go to ER. Hemostasis achieved and pressure bandage applied.  - Dermatology pathology([place])   Return if symptoms worsen or fail to improve.  Orma Flaming, MD [place]   03/02/2019

## 2019-03-02 NOTE — Patient Instructions (Signed)
Skin Tag, Adult  A skin tag (acrochordon) is a soft, extra growth of skin. Most skin tags are flesh-colored and rarely bigger than a pencil eraser. They commonly form near areas where there are folds in the skin, such as the armpit or groin. Skin tags are not dangerous, and they do not spread from person to person (are not contagious). You may have one skin tag or several. Skin tags do not require treatment. However, your health care provider may recommend removal of a skin tag if it:  Gets irritated from clothing.  Bleeds.  Is visible and unsightly. Your health care provider can remove skin tags with a simple surgical procedure or a procedure that involves freezing the skin tag. Follow these instructions at home:  Watch for any changes in your skin tag. A normal skin tag does not require any other special care at home.  Take over-the-counter and prescription medicines only as told by your health care provider.  Keep all follow-up visits as told by your health care provider. This is important. Contact a health care provider if:  You have a skin tag that: ? Becomes painful. ? Changes color. ? Bleeds. ? Swells.  You develop more skin tags. This information is not intended to replace advice given to you by your health care provider. Make sure you discuss any questions you have with your health care provider. Document Released: 06/24/2015 Document Revised: 05/22/2017 Document Reviewed: 06/24/2015 Elsevier Patient Education  2020 Elsevier Inc.  

## 2019-03-03 DIAGNOSIS — F411 Generalized anxiety disorder: Secondary | ICD-10-CM | POA: Diagnosis not present

## 2019-03-07 DIAGNOSIS — O09522 Supervision of elderly multigravida, second trimester: Secondary | ICD-10-CM | POA: Diagnosis not present

## 2019-03-07 DIAGNOSIS — Z3A23 23 weeks gestation of pregnancy: Secondary | ICD-10-CM | POA: Diagnosis not present

## 2019-03-09 ENCOUNTER — Ambulatory Visit: Payer: Self-pay

## 2019-03-09 NOTE — Telephone Encounter (Signed)
Provided Pt patholgy report on wart removed per Rogers Blocker . Patient  Voice understanding.

## 2019-03-17 DIAGNOSIS — F411 Generalized anxiety disorder: Secondary | ICD-10-CM | POA: Diagnosis not present

## 2019-04-01 DIAGNOSIS — F411 Generalized anxiety disorder: Secondary | ICD-10-CM | POA: Diagnosis not present

## 2019-04-04 DIAGNOSIS — Z23 Encounter for immunization: Secondary | ICD-10-CM | POA: Diagnosis not present

## 2019-04-04 DIAGNOSIS — O09522 Supervision of elderly multigravida, second trimester: Secondary | ICD-10-CM | POA: Diagnosis not present

## 2019-04-04 DIAGNOSIS — Z3689 Encounter for other specified antenatal screening: Secondary | ICD-10-CM | POA: Diagnosis not present

## 2019-04-04 DIAGNOSIS — Z3A27 27 weeks gestation of pregnancy: Secondary | ICD-10-CM | POA: Diagnosis not present

## 2019-04-18 DIAGNOSIS — Z3A29 29 weeks gestation of pregnancy: Secondary | ICD-10-CM | POA: Diagnosis not present

## 2019-04-18 DIAGNOSIS — Z3689 Encounter for other specified antenatal screening: Secondary | ICD-10-CM | POA: Diagnosis not present

## 2019-04-18 DIAGNOSIS — Z23 Encounter for immunization: Secondary | ICD-10-CM | POA: Diagnosis not present

## 2019-04-18 DIAGNOSIS — O09523 Supervision of elderly multigravida, third trimester: Secondary | ICD-10-CM | POA: Diagnosis not present

## 2019-04-21 DIAGNOSIS — F411 Generalized anxiety disorder: Secondary | ICD-10-CM | POA: Diagnosis not present

## 2019-05-02 DIAGNOSIS — Z3A31 31 weeks gestation of pregnancy: Secondary | ICD-10-CM | POA: Diagnosis not present

## 2019-05-02 DIAGNOSIS — O09523 Supervision of elderly multigravida, third trimester: Secondary | ICD-10-CM | POA: Diagnosis not present

## 2019-05-16 DIAGNOSIS — F411 Generalized anxiety disorder: Secondary | ICD-10-CM | POA: Diagnosis not present

## 2019-05-16 DIAGNOSIS — O09523 Supervision of elderly multigravida, third trimester: Secondary | ICD-10-CM | POA: Diagnosis not present

## 2019-05-16 DIAGNOSIS — Z3A33 33 weeks gestation of pregnancy: Secondary | ICD-10-CM | POA: Diagnosis not present

## 2019-05-17 ENCOUNTER — Other Ambulatory Visit: Payer: Self-pay | Admitting: Obstetrics

## 2019-05-30 DIAGNOSIS — Z3685 Encounter for antenatal screening for Streptococcus B: Secondary | ICD-10-CM | POA: Diagnosis not present

## 2019-05-30 DIAGNOSIS — Z3A35 35 weeks gestation of pregnancy: Secondary | ICD-10-CM | POA: Diagnosis not present

## 2019-05-30 DIAGNOSIS — O09523 Supervision of elderly multigravida, third trimester: Secondary | ICD-10-CM | POA: Diagnosis not present

## 2019-05-30 LAB — OB RESULTS CONSOLE GBS: GBS: NEGATIVE

## 2019-05-31 DIAGNOSIS — F411 Generalized anxiety disorder: Secondary | ICD-10-CM | POA: Diagnosis not present

## 2019-06-06 DIAGNOSIS — Z3A36 36 weeks gestation of pregnancy: Secondary | ICD-10-CM | POA: Diagnosis not present

## 2019-06-06 DIAGNOSIS — O09523 Supervision of elderly multigravida, third trimester: Secondary | ICD-10-CM | POA: Diagnosis not present

## 2019-06-13 ENCOUNTER — Encounter (HOSPITAL_COMMUNITY): Payer: Self-pay | Admitting: *Deleted

## 2019-06-13 ENCOUNTER — Telehealth (HOSPITAL_COMMUNITY): Payer: Self-pay | Admitting: *Deleted

## 2019-06-13 DIAGNOSIS — Z3A37 37 weeks gestation of pregnancy: Secondary | ICD-10-CM | POA: Diagnosis not present

## 2019-06-13 DIAGNOSIS — O09523 Supervision of elderly multigravida, third trimester: Secondary | ICD-10-CM | POA: Diagnosis not present

## 2019-06-13 NOTE — Telephone Encounter (Signed)
Preadmission screen  

## 2019-06-13 NOTE — Patient Instructions (Signed)
[place]  06/13/2019   Your procedure is scheduled on:  06/22/2019  Arrive at 12:00 at Entrance C on [address] at [facility]  and [address]. You are invited to use the FREE valet parking or use the Visitor's parking deck.  Pick up the phone at the desk and dial [phone].  Call this number if you have problems the morning of surgery: [phone]  Remember:   Do not eat food:(After Midnight) Desps de medianoche.  Do not drink clear liquids: (After Midnight) Desps de medianoche.  Take these medicines the morning of surgery with A SIP OF WATER:  prozac   Do not wear jewelry, make-up or nail polish.  Do not wear lotions, powders, or perfumes. Do not wear deodorant.  Do not shave 48 hours prior to surgery.  Do not bring valuables to the hospital.  [facility] is not   responsible for any belongings or valuables brought to the hospital.  Contacts, dentures or bridgework may not be worn into surgery.  Leave suitcase in the car. After surgery it may be brought to your room.  For patients admitted to the hospital, checkout time is 11:00 AM the day of              discharge.      Please read over the following fact sheets that you were given:     Preparing for Surgery

## 2019-06-14 ENCOUNTER — Encounter (HOSPITAL_COMMUNITY): Payer: Self-pay

## 2019-06-14 DIAGNOSIS — F411 Generalized anxiety disorder: Secondary | ICD-10-CM | POA: Diagnosis not present

## 2019-06-20 ENCOUNTER — Other Ambulatory Visit (HOSPITAL_COMMUNITY)
Admission: RE | Admit: 2019-06-20 | Discharge: 2019-06-20 | Disposition: A | Discharge status: Home / Self Care | Payer: BC Managed Care – PPO | Source: Ambulatory Visit | Attending: Obstetrics | Admitting: Obstetrics

## 2019-06-20 ENCOUNTER — Other Ambulatory Visit: Payer: Self-pay

## 2019-06-20 DIAGNOSIS — O09523 Supervision of elderly multigravida, third trimester: Secondary | ICD-10-CM | POA: Diagnosis not present

## 2019-06-20 DIAGNOSIS — Z20828 Contact with and (suspected) exposure to other viral communicable diseases: Secondary | ICD-10-CM | POA: Insufficient documentation

## 2019-06-20 DIAGNOSIS — Z3A38 38 weeks gestation of pregnancy: Secondary | ICD-10-CM | POA: Diagnosis not present

## 2019-06-20 HISTORY — DX: Supervision of pregnancy with other poor reproductive or obstetric history, unspecified trimester: O09.299

## 2019-06-20 HISTORY — DX: Carpal tunnel syndrome, left upper limb: G56.02

## 2019-06-20 LAB — CBC
HCT: 39.6 % (ref 36.0–46.0)
Hemoglobin: 13.6 g/dL (ref 12.0–15.0)
MCH: 30 pg (ref 26.0–34.0)
MCHC: 34.3 g/dL (ref 30.0–36.0)
MCV: 87.4 fL (ref 80.0–100.0)
Platelets: 213 10*3/uL (ref 150–400)
RBC: 4.53 MIL/uL (ref 3.87–5.11)
RDW: 13.3 % (ref 11.5–15.5)
WBC: 9.9 10*3/uL (ref 4.0–10.5)
nRBC: 0 % (ref 0.0–0.2)

## 2019-06-20 LAB — RPR: RPR Ser Ql: NONREACTIVE

## 2019-06-20 LAB — SARS CORONAVIRUS 2 (TAT 6-24 HRS): SARS Coronavirus 2: NEGATIVE

## 2019-06-20 LAB — TYPE AND SCREEN
ABO/RH(D): O POS
Antibody Screen: NEGATIVE

## 2019-06-20 LAB — ABO/RH: ABO/RH(D): O POS

## 2019-06-20 NOTE — MAU Note (Signed)
Lab called for pre-op draw.

## 2019-06-21 NOTE — H&P (Signed)
Kara Murphy is a 38 y.o. G2P1 at [redacted]w[redacted]d presenting for RCS with b/l salpingectomy. Pt notes rare contractions. Good fetal movement, No vaginal bleeding, not leaking fluid.  PNCare at [facility] since 7 wks - Dated by 7 wk u/s - h/o severe PEC at 34 wks with PCS. Anxiety better managed this preg, on baby ASA, no evidence PIH - anxiety, on 20mg  fluoxetine - GERD, on Pepcid - AMA, nl Panorma - Desires sterilization   Prenatal Transfer Tool  Maternal Diabetes: No Genetic Screening: Normal Maternal Ultrasounds/Referrals: Normal Fetal Ultrasounds or other Referrals:  None Maternal Substance Abuse:  No Significant Maternal Medications:  None Significant Maternal Lab Results: Group B Strep negative     OB History    Gravida  2   Para  1   Term      Preterm      AB      Living        SAB      TAB      Ectopic      Multiple      Live Births             Past Medical History:  Diagnosis Date  . Anxiety   . Carpal tunnel syndrome on left   . Depression   . Genital warts   . History of chicken pox   . History of fainting spells of unknown cause   . History of pre-eclampsia in prior pregnancy, currently pregnant   . Hypertension   . Screening for HIV (human immunodeficiency virus)    Past Surgical History:  Procedure Laterality Date  . CESAREAN SECTION N/A 05/13/2016   Procedure: CESAREAN SECTION;  Surgeon: Aloha Gell, MD;  Location: [facility];  Service: Obstetrics;  Laterality: N/A;  . KNEE SURGERY Right 1999   Family History: family history includes Alzheimer's disease in her maternal grandmother; Arthritis in her maternal grandfather and maternal grandmother; Cancer in her paternal grandfather and paternal grandmother; Depression in her father and mother; Drug abuse in her father; Heart attack in her paternal grandfather; Heart disease in her father; Hypertension in her mother; Mental illness in her father; Mental retardation in her father  and sister; Stroke in her maternal grandfather. Social History:  reports that she has quit smoking. Her smoking use included cigarettes. She has never used smokeless tobacco. She reports current alcohol use. She reports that she does not use drugs.  Review of Systems - Negative except discomfort of pregnancy   Physical Exam:  Vitals:   06/22/19 1217 06/22/19 1229  BP: (!) 155/102 (!) 149/102  Pulse: 93   Resp: 18   Temp: 97.9 F (36.6 C)   TempSrc: Oral   SpO2: 100%   Weight: 79.4 kg   Height: 5\' 2"  (1.575 m)     Gen: well appearing, no distress CV: RRR Pulm: CTAB Back: no CVAT Abd: gravid, NT, no RUQ pain LE: no edema, equal bilaterally, non-tender   Prenatal labs: ABO, Rh: --/--/O POS Performed at [facility] [address] [place], [place] [id number]  [phone]) Antibody: NEG (12/28 [id number]) Rubella: Immune (06/10 0000) RPR: NON REACTIVE (12/28 0907)  HBsAg: Negative (06/10 0000)  HIV: Non-reactive (06/10 0000)  GBS: Negative/-- (12/07 0000)  1 hr Glucola 125  Genetic screening nl Panorama, nl AFP Anatomy US normal   Assessment/Plan: 38 y.o. G2P1 at [redacted]w[redacted]d Prior c/s, plan RCS Undesired fertility, plan b/l salpingectomy  H/o anxiety, offer SW consult, cont fluoxetine 20mg   daily  Watch bp   Ala Dach 06/22/2019 2:35 PM

## 2019-06-21 NOTE — Anesthesia Preprocedure Evaluation (Signed)
Anesthesia Evaluation  Patient identified by MRN, date of birth, ID band Patient awake    Reviewed: Allergy & Precautions, NPO status , Patient's Chart, lab work & pertinent test results  History of Anesthesia Complications Negative for: history of anesthetic complications  Airway Mallampati: II  TM Distance: >3 FB Neck ROM: Full    Dental  (+) Teeth Intact, Dental Advisory Given   Pulmonary neg shortness of breath, neg sleep apnea, neg COPD, neg recent URI, former smoker,    Pulmonary exam normal breath sounds clear to auscultation       Cardiovascular hypertension, Pt. on medications (-) angina Rhythm:Regular Rate:Normal     Neuro/Psych PSYCHIATRIC DISORDERS Anxiety Depression negative neurological ROS     GI/Hepatic Neg liver ROS, GERD  ,  Endo/Other  negative endocrine ROS  Renal/GU negative Renal ROS     Musculoskeletal   Abdominal   Peds  Hematology negative hematology ROS (+)   Anesthesia Other Findings   Reproductive/Obstetrics (+) Pregnancy                             Anesthesia Physical  Anesthesia Plan  ASA: III  Anesthesia Plan: Spinal   Post-op Pain Management:    Induction:   PONV Risk Score and Plan: Scopolamine patch - Pre-op  Airway Management Planned: Natural Airway and Nasal Cannula  Additional Equipment:   Intra-op Plan:   Post-operative Plan:   Informed Consent:   Plan Discussed with: CRNA and Anesthesiologist  Anesthesia Plan Comments: (I have discussed risks of neuraxial anesthesia including but not limited to infection, bleeding, nerve injury, back pain, headache, seizures, and failure of block. Patient denies bleeding disorders and is not currently anticoagulated. Labs have been reviewed. Risks and benefits discussed. All patient's questions answered.  )        Anesthesia Quick Evaluation

## 2019-06-22 ENCOUNTER — Encounter (HOSPITAL_COMMUNITY)
Admission: AD | Disposition: A | Discharge status: Home / Self Care | Payer: Self-pay | Source: Home / Self Care | Attending: Obstetrics

## 2019-06-22 ENCOUNTER — Inpatient Hospital Stay (HOSPITAL_COMMUNITY): Payer: BC Managed Care – PPO | Admitting: Anesthesiology

## 2019-06-22 ENCOUNTER — Inpatient Hospital Stay (HOSPITAL_COMMUNITY)
Admission: AD | Admit: 2019-06-22 | Discharge: 2019-06-25 | DRG: 785 | Disposition: A | Discharge status: Home / Self Care | Payer: BC Managed Care – PPO | Attending: Obstetrics | Admitting: Obstetrics

## 2019-06-22 ENCOUNTER — Encounter (HOSPITAL_COMMUNITY): Payer: Self-pay | Admitting: Obstetrics

## 2019-06-22 ENCOUNTER — Other Ambulatory Visit: Payer: Self-pay

## 2019-06-22 DIAGNOSIS — F419 Anxiety disorder, unspecified: Secondary | ICD-10-CM | POA: Diagnosis present

## 2019-06-22 DIAGNOSIS — O9962 Diseases of the digestive system complicating childbirth: Secondary | ICD-10-CM | POA: Diagnosis not present

## 2019-06-22 DIAGNOSIS — Z3A39 39 weeks gestation of pregnancy: Secondary | ICD-10-CM

## 2019-06-22 DIAGNOSIS — Z3A00 Weeks of gestation of pregnancy not specified: Secondary | ICD-10-CM | POA: Diagnosis not present

## 2019-06-22 DIAGNOSIS — O34219 Maternal care for unspecified type scar from previous cesarean delivery: Secondary | ICD-10-CM | POA: Diagnosis not present

## 2019-06-22 DIAGNOSIS — Z87891 Personal history of nicotine dependence: Secondary | ICD-10-CM | POA: Diagnosis not present

## 2019-06-22 DIAGNOSIS — O99344 Other mental disorders complicating childbirth: Secondary | ICD-10-CM | POA: Diagnosis present

## 2019-06-22 DIAGNOSIS — Z302 Encounter for sterilization: Secondary | ICD-10-CM | POA: Diagnosis not present

## 2019-06-22 DIAGNOSIS — K219 Gastro-esophageal reflux disease without esophagitis: Secondary | ICD-10-CM | POA: Diagnosis present

## 2019-06-22 DIAGNOSIS — O34211 Maternal care for low transverse scar from previous cesarean delivery: Principal | ICD-10-CM | POA: Diagnosis present

## 2019-06-22 DIAGNOSIS — Z412 Encounter for routine and ritual male circumcision: Secondary | ICD-10-CM | POA: Diagnosis not present

## 2019-06-22 DIAGNOSIS — O134 Gestational [pregnancy-induced] hypertension without significant proteinuria, complicating childbirth: Secondary | ICD-10-CM | POA: Diagnosis present

## 2019-06-22 DIAGNOSIS — O165 Unspecified maternal hypertension, complicating the puerperium: Secondary | ICD-10-CM | POA: Diagnosis not present

## 2019-06-22 DIAGNOSIS — Z23 Encounter for immunization: Secondary | ICD-10-CM | POA: Diagnosis not present

## 2019-06-22 DIAGNOSIS — F411 Generalized anxiety disorder: Secondary | ICD-10-CM | POA: Diagnosis present

## 2019-06-22 DIAGNOSIS — Z98891 History of uterine scar from previous surgery: Secondary | ICD-10-CM

## 2019-06-22 SURGERY — Surgical Case
Anesthesia: Spinal | Site: Abdomen | Laterality: Bilateral | Wound class: Clean Contaminated

## 2019-06-22 MED ORDER — MEPERIDINE HCL 25 MG/ML IJ SOLN: 6.2500 mg | INTRAMUSCULAR | Status: DC | PRN

## 2019-06-22 MED ORDER — ACETAMINOPHEN 325 MG PO TABS: 325.0000 mg | ORAL_TABLET | ORAL | Status: DC | PRN

## 2019-06-22 MED ORDER — COCONUT OIL OIL: 1.0000 "application " | TOPICAL_OIL | Status: DC | PRN

## 2019-06-22 MED ORDER — SIMETHICONE 80 MG PO CHEW: 80.0000 mg | CHEWABLE_TABLET | ORAL | Status: DC | PRN

## 2019-06-22 MED ORDER — STERILE WATER FOR IRRIGATION IR SOLN
Status: DC | PRN
  Administered 2019-06-22: 1

## 2019-06-22 MED ORDER — OXYTOCIN 40 UNITS IN NORMAL SALINE INFUSION - SIMPLE MED: 2.5000 [IU]/h | INTRAVENOUS | Status: AC

## 2019-06-22 MED ORDER — DEXAMETHASONE SODIUM PHOSPHATE 10 MG/ML IJ SOLN
INTRAMUSCULAR | Status: AC
  Filled 2019-06-22: qty 1

## 2019-06-22 MED ORDER — MENTHOL 3 MG MT LOZG: 1.0000 | LOZENGE | OROMUCOSAL | Status: DC | PRN

## 2019-06-22 MED ORDER — LACTATED RINGERS IV SOLN: INTRAVENOUS | Status: DC

## 2019-06-22 MED ORDER — OXYTOCIN 40 UNITS IN NORMAL SALINE INFUSION - SIMPLE MED
INTRAVENOUS | Status: AC
  Filled 2019-06-22: qty 1000

## 2019-06-22 MED ORDER — OXYCODONE-ACETAMINOPHEN 5-325 MG PO TABS: 1.0000 | ORAL_TABLET | ORAL | Status: DC | PRN

## 2019-06-22 MED ORDER — FENTANYL CITRATE (PF) 100 MCG/2ML IJ SOLN
INTRAMUSCULAR | Status: AC
  Filled 2019-06-22: qty 2

## 2019-06-22 MED ORDER — DIBUCAINE (PERIANAL) 1 % EX OINT: 1.0000 "application " | TOPICAL_OINTMENT | CUTANEOUS | Status: DC | PRN

## 2019-06-22 MED ORDER — PHENYLEPHRINE HCL-NACL 20-0.9 MG/250ML-% IV SOLN
INTRAVENOUS | Status: AC
  Filled 2019-06-22: qty 250

## 2019-06-22 MED ORDER — FLUOXETINE HCL 20 MG PO CAPS
20.0000 mg | ORAL_CAPSULE | Freq: Two times a day (BID) | ORAL | Status: DC
  Administered 2019-06-22 – 2019-06-25 (×6): 20 mg via ORAL
  Filled 2019-06-22 (×6): qty 1

## 2019-06-22 MED ORDER — ONDANSETRON HCL 4 MG/2ML IJ SOLN
INTRAMUSCULAR | Status: AC
  Filled 2019-06-22: qty 2

## 2019-06-22 MED ORDER — MORPHINE SULFATE (PF) 0.5 MG/ML IJ SOLN
INTRAMUSCULAR | Status: AC
  Filled 2019-06-22: qty 10

## 2019-06-22 MED ORDER — CEFAZOLIN SODIUM-DEXTROSE 2-4 GM/100ML-% IV SOLN
INTRAVENOUS | Status: AC
  Filled 2019-06-22: qty 100

## 2019-06-22 MED ORDER — SIMETHICONE 80 MG PO CHEW
80.0000 mg | CHEWABLE_TABLET | ORAL | Status: DC
  Administered 2019-06-23 – 2019-06-24 (×3): 80 mg via ORAL
  Filled 2019-06-22 (×3): qty 1

## 2019-06-22 MED ORDER — MORPHINE SULFATE (PF) 0.5 MG/ML IJ SOLN
INTRAMUSCULAR | Status: DC | PRN
  Administered 2019-06-22: 150 ug via INTRATHECAL

## 2019-06-22 MED ORDER — PRENATAL MULTIVITAMIN CH
1.0000 | ORAL_TABLET | Freq: Every day | ORAL | Status: DC
  Administered 2019-06-23 – 2019-06-24 (×2): 1 via ORAL
  Filled 2019-06-22 (×2): qty 1

## 2019-06-22 MED ORDER — ACETAMINOPHEN 325 MG PO TABS
650.0000 mg | ORAL_TABLET | Freq: Four times a day (QID) | ORAL | Status: DC | PRN
  Administered 2019-06-24 – 2019-06-25 (×2): 650 mg via ORAL
  Filled 2019-06-22 (×2): qty 2

## 2019-06-22 MED ORDER — SENNOSIDES-DOCUSATE SODIUM 8.6-50 MG PO TABS
2.0000 | ORAL_TABLET | ORAL | Status: DC
  Administered 2019-06-23 – 2019-06-24 (×2): 2 via ORAL
  Filled 2019-06-22 (×3): qty 2

## 2019-06-22 MED ORDER — KETOROLAC TROMETHAMINE 30 MG/ML IJ SOLN
INTRAMUSCULAR | Status: AC
  Filled 2019-06-22: qty 1

## 2019-06-22 MED ORDER — PHENYLEPHRINE HCL-NACL 20-0.9 MG/250ML-% IV SOLN
INTRAVENOUS | Status: DC | PRN
  Administered 2019-06-22: 30 ug/min via INTRAVENOUS

## 2019-06-22 MED ORDER — SODIUM CHLORIDE 0.9 % IV SOLN: INTRAVENOUS | Status: DC | PRN

## 2019-06-22 MED ORDER — WITCH HAZEL-GLYCERIN EX PADS: 1.0000 "application " | MEDICATED_PAD | CUTANEOUS | Status: DC | PRN

## 2019-06-22 MED ORDER — OXYCODONE HCL 5 MG PO TABS: 5.0000 mg | ORAL_TABLET | Freq: Once | ORAL | Status: DC | PRN

## 2019-06-22 MED ORDER — ONDANSETRON HCL 4 MG/2ML IJ SOLN: 4.0000 mg | Freq: Once | INTRAMUSCULAR | Status: DC | PRN

## 2019-06-22 MED ORDER — DEXAMETHASONE SODIUM PHOSPHATE 10 MG/ML IJ SOLN
INTRAMUSCULAR | Status: DC | PRN
  Administered 2019-06-22: 10 mg via INTRAVENOUS

## 2019-06-22 MED ORDER — IBUPROFEN 800 MG PO TABS
800.0000 mg | ORAL_TABLET | Freq: Three times a day (TID) | ORAL | Status: DC
  Administered 2019-06-23 – 2019-06-25 (×6): 800 mg via ORAL
  Filled 2019-06-22 (×6): qty 1

## 2019-06-22 MED ORDER — ACETAMINOPHEN 160 MG/5ML PO SOLN: 325.0000 mg | ORAL | Status: DC | PRN

## 2019-06-22 MED ORDER — KETOROLAC TROMETHAMINE 30 MG/ML IJ SOLN
30.0000 mg | Freq: Once | INTRAMUSCULAR | Status: AC
  Administered 2019-06-22: 30 mg via INTRAVENOUS

## 2019-06-22 MED ORDER — SODIUM CHLORIDE 0.9 % IR SOLN
Status: DC | PRN
  Administered 2019-06-22: 1

## 2019-06-22 MED ORDER — OXYCODONE HCL 5 MG/5ML PO SOLN: 5.0000 mg | Freq: Once | ORAL | Status: DC | PRN

## 2019-06-22 MED ORDER — SIMETHICONE 80 MG PO CHEW
80.0000 mg | CHEWABLE_TABLET | Freq: Three times a day (TID) | ORAL | Status: DC
  Administered 2019-06-23 – 2019-06-25 (×5): 80 mg via ORAL
  Filled 2019-06-22 (×6): qty 1

## 2019-06-22 MED ORDER — KETOROLAC TROMETHAMINE 30 MG/ML IJ SOLN
30.0000 mg | Freq: Four times a day (QID) | INTRAMUSCULAR | Status: AC
  Administered 2019-06-23 (×3): 30 mg via INTRAVENOUS
  Filled 2019-06-22 (×3): qty 1

## 2019-06-22 MED ORDER — ZOLPIDEM TARTRATE 5 MG PO TABS: 5.0000 mg | ORAL_TABLET | Freq: Every evening | ORAL | Status: DC | PRN

## 2019-06-22 MED ORDER — TETANUS-DIPHTH-ACELL PERTUSSIS 5-2.5-18.5 LF-MCG/0.5 IM SUSP: 0.5000 mL | Freq: Once | INTRAMUSCULAR | Status: DC

## 2019-06-22 MED ORDER — FENTANYL CITRATE (PF) 100 MCG/2ML IJ SOLN: 25.0000 ug | INTRAMUSCULAR | Status: DC | PRN

## 2019-06-22 MED ORDER — DIPHENHYDRAMINE HCL 25 MG PO CAPS: 25.0000 mg | ORAL_CAPSULE | Freq: Four times a day (QID) | ORAL | Status: DC | PRN

## 2019-06-22 MED ORDER — FENTANYL CITRATE (PF) 100 MCG/2ML IJ SOLN
INTRAMUSCULAR | Status: DC | PRN
  Administered 2019-06-22: 15 ug via INTRATHECAL

## 2019-06-22 MED ORDER — ONDANSETRON HCL 4 MG/2ML IJ SOLN
INTRAMUSCULAR | Status: DC | PRN
  Administered 2019-06-22: 4 mg via INTRAVENOUS

## 2019-06-22 MED ORDER — BUPIVACAINE IN DEXTROSE 0.75-8.25 % IT SOLN
INTRATHECAL | Status: DC | PRN
  Administered 2019-06-22: 1.5 mL via INTRATHECAL

## 2019-06-22 MED ORDER — OXYTOCIN 10 UNIT/ML IJ SOLN
INTRAMUSCULAR | Status: DC | PRN
  Administered 2019-06-22: 40 [IU]

## 2019-06-22 MED ORDER — CEFAZOLIN SODIUM-DEXTROSE 2-4 GM/100ML-% IV SOLN
2.0000 g | INTRAVENOUS | Status: AC
  Administered 2019-06-22: 15:00:00 2 g via INTRAVENOUS

## 2019-06-22 SURGICAL SUPPLY — 33 items
BENZOIN TINCTURE PRP APPL 2/3 (GAUZE/BANDAGES/DRESSINGS) ×2 IMPLANT
CLAMP CORD UMBIL (MISCELLANEOUS) IMPLANT
CLOTH BEACON ORANGE TIMEOUT ST (SAFETY) ×2 IMPLANT
DRSG OPSITE POSTOP 4X10 (GAUZE/BANDAGES/DRESSINGS) ×2 IMPLANT
ELECT REM PT RETURN 9FT ADLT (ELECTROSURGICAL) ×2
ELECTRODE REM PT RTRN 9FT ADLT (ELECTROSURGICAL) ×1 IMPLANT
EXTRACTOR VACUUM M CUP 4 TUBE (SUCTIONS) IMPLANT
GLOVE BIO SURGEON STRL SZ 6.5 (GLOVE) ×2 IMPLANT
GLOVE BIOGEL PI IND STRL 7.0 (GLOVE) ×2 IMPLANT
GLOVE BIOGEL PI INDICATOR 7.0 (GLOVE) ×2
GOWN STRL REUS W/TWL LRG LVL3 (GOWN DISPOSABLE) ×4 IMPLANT
KIT ABG SYR 3ML LUER SLIP (SYRINGE) IMPLANT
NEEDLE HYPO 22GX1.5 SAFETY (NEEDLE) IMPLANT
NEEDLE HYPO 25X5/8 SAFETYGLIDE (NEEDLE) IMPLANT
NS IRRIG 1000ML POUR BTL (IV SOLUTION) ×2 IMPLANT
PACK C SECTION WH (CUSTOM PROCEDURE TRAY) ×2 IMPLANT
PAD OB MATERNITY 4.3X12.25 (PERSONAL CARE ITEMS) ×2 IMPLANT
PENCIL SMOKE EVAC W/HOLSTER (ELECTROSURGICAL) ×2 IMPLANT
STRIP CLOSURE SKIN 1/2X4 (GAUZE/BANDAGES/DRESSINGS) ×2 IMPLANT
SUT MON AB 4-0 PS1 27 (SUTURE) ×2 IMPLANT
SUT PLAIN 0 NONE (SUTURE) IMPLANT
SUT PLAIN 2 0 (SUTURE) ×1
SUT PLAIN 2 0 XLH (SUTURE) IMPLANT
SUT PLAIN ABS 2-0 CT1 27XMFL (SUTURE) ×1 IMPLANT
SUT VIC AB 0 CT1 36 (SUTURE) ×4 IMPLANT
SUT VIC AB 0 CTX 36 (SUTURE) ×3
SUT VIC AB 0 CTX36XBRD ANBCTRL (SUTURE) ×3 IMPLANT
SUT VIC AB 2-0 CT1 27 (SUTURE) ×2
SUT VIC AB 2-0 CT1 TAPERPNT 27 (SUTURE) ×2 IMPLANT
SYR CONTROL 10ML LL (SYRINGE) IMPLANT
TOWEL OR 17X24 6PK STRL BLUE (TOWEL DISPOSABLE) ×2 IMPLANT
TRAY FOLEY W/BAG SLVR 14FR LF (SET/KITS/TRAYS/PACK) IMPLANT
WATER STERILE IRR 1000ML POUR (IV SOLUTION) ×2 IMPLANT

## 2019-06-22 NOTE — Brief Op Note (Signed)
06/22/2019  3:58 PM  PATIENT:  [place]  38 y.o. female  PRE-OPERATIVE DIAGNOSIS:  Previous Cesarean Section, Desires Sterilization  POST-OPERATIVE DIAGNOSIS:  Previous Cesarean Section, Desires Sterilization  PROCEDURE:  Procedure(s) with comments: Repeat CESAREAN SECTION WITH BILATERAL TUBAL LIGATION by Salpingectomy (Bilateral) - EDD: 06/29/19  SURGEON:  Surgeon(s) and Role:    * Aloha Gell, MD - Primary    * Almquist, Earlyne Iba, MD - Assisting  PHYSICIAN ASSISTANT:   ASSISTANTS: as above   ANESTHESIA:   spinal  EBL:  450 cc (surgeon estimate)  BLOOD ADMINISTERED:none  DRAINS: Urinary Catheter (Foley)   LOCAL MEDICATIONS USED:  NONE  SPECIMEN:  Source of Specimen:  placenta, b/l fallopian tubes  DISPOSITION OF SPECIMEN:  PATHOLOGY  COUNTS:  YES  TOURNIQUET:  * No tourniquets in log *  DICTATION: .Note written in EPIC  PLAN OF CARE: Admit to inpatient   PATIENT DISPOSITION:  PACU - hemodynamically stable.   Delay start of Pharmacological VTE agent (>24hrs) due to surgical blood loss or risk of bleeding: yes

## 2019-06-22 NOTE — Anesthesia Postprocedure Evaluation (Signed)
Anesthesia Post Note  Patient: Kara Murphy  Procedure(s) Performed: Repeat CESAREAN SECTION WITH BILATERAL TUBAL LIGATION by Salpingectomy (Bilateral Abdomen)     Anesthesia Type: Spinal    Last Vitals:  Vitals:   06/22/19 1700 06/22/19 1711  BP: (!) 144/92 (!) 147/91  Pulse: 73 75  Resp: 16 18  Temp:  36.4 C  SpO2: 99% 98%    Last Pain:  Vitals:   06/22/19 1711  TempSrc: Oral  PainSc:    Pain Goal:    LLE Motor Response: Purposeful movement (06/22/19 1700) LLE Sensation: Tingling (06/22/19 1700) RLE Motor Response: Purposeful movement (06/22/19 1700) RLE Sensation: Tingling (06/22/19 1700)     Epidural/Spinal Function Cutaneous sensation: Tingles (06/22/19 1700), Patient able to flex knees: Yes (06/22/19 1700), Patient able to lift hips off bed: No (06/22/19 1700), Back pain beyond tenderness at insertion site: No (06/22/19 1700), Progressively worsening motor and/or sensory loss: No (06/22/19 1700), Bowel and/or bladder incontinence post epidural: No (06/22/19 1700)  Tymara Saur

## 2019-06-22 NOTE — Lactation Note (Signed)
This note was copied from a baby's chart. Lactation Consultation Note  Patient Name: Kara Murphy [id number] Date: 06/22/2019 Reason for consult: Initial assessment  P2 mom.  Exclusively pumped for 7 months with her now 38 year old.  Previous child was 35 wk, in NICU.  Mom had difficulty trying to feed.  Mom is excited this baby has already bf.  Mom desires to try again with LC at bedside.  Mom demonstrates hand expression; drops seen easily.  Colostrum container provider for collection.  Infant swaddled and placed STS.  Mom attempted feed in cradle hold.  Infant slept.  LC reviewed cross cradle and football with mom in order to have better head/neck support for infant.  Infant too sleepy to feed; no latch achieved.  LC reviewed bf basics; feeding cues, STS, 8-12 feeds in 24 hours, and information was provided on lactation phone line, OP appt., and support groups.  Maternal Data Has patient been taught Hand Expression?: Yes Does the patient have breastfeeding experience prior to this delivery?: Yes  Feeding Feeding Type: Breast Fed  LATCH Score Latch: Too sleepy or reluctant, no latch achieved, no sucking elicited.  Audible Swallowing: None  Type of Nipple: Everted at rest and after stimulation  Comfort (Breast/Nipple): Soft / non-tender  Hold (Positioning): Assistance needed to correctly position infant at breast and maintain latch.  LATCH Score: 5  Interventions Interventions: Breast feeding basics reviewed;Assisted with latch;Skin to skin;Breast massage;Hand express;Position options;Support pillows  Lactation Tools Discussed/Used     Consult Status Consult Status: Follow-up Date: 06/23/19 Follow-up type: In-patient    Ferne Coe [facility] 06/22/2019, 8:37 PM

## 2019-06-22 NOTE — Op Note (Signed)
06/22/2019  3:58 PM  PATIENT:  Whitley  38 y.o. female  PRE-OPERATIVE DIAGNOSIS:  Previous Cesarean Section, Desires Sterilization  POST-OPERATIVE DIAGNOSIS:  Previous Cesarean Section, Desires Sterilization  PROCEDURE:  Procedure(s) with comments: Repeat CESAREAN SECTION WITH BILATERAL TUBAL LIGATION by Salpingectomy (Bilateral) - EDD: 06/29/19  SURGEON:  Surgeon(s) and Role:    * Aloha Gell, MD - Primary    * Almquist, Earlyne Iba, MD - Assisting  PHYSICIAN ASSISTANT:   ASSISTANTS: as above   ANESTHESIA:   spinal  EBL:  450 cc (surgeon estimate)  BLOOD ADMINISTERED:none  DRAINS: Urinary Catheter (Foley)   LOCAL MEDICATIONS USED:  NONE  SPECIMEN:  Source of Specimen:  placenta, b/l fallopian tubes  DISPOSITION OF SPECIMEN:  PATHOLOGY  COUNTS:  YES  TOURNIQUET:  * No tourniquets in log *  DICTATION: .Note written in EPIC  PLAN OF CARE: Admit to inpatient   PATIENT DISPOSITION:  PACU - hemodynamically stable.   Delay start of Pharmacological VTE agent (>24hrs) due to surgical blood loss or risk of bleeding: yes    Findings:  @BABYSEXEBC @ infant,  APGAR (1 MIN): 9   APGAR (5 MINS): 9   APGAR (10 MINS):   Normal uterus, tubes and ovaries, normal placenta. 3VC, clear amniotic fluid  EBL: 450 cc Antibiotics:   2g Ancef Complications: none  Indications: This is a 38 y.o. year-old, G2P1  At [redacted]w[redacted]d admitted for RCS and permanent sterilization by salpingectomy. Risks benefits and alternatives of the procedure were discussed with the patient who agreed to proceed  Procedure:  After informed consent was obtained the patient was taken to the operating room where spinal anesthesia was initiated.  She was prepped and draped in the normal sterile fashion in dorsal supine position with a leftward tilt.  A foley catheter was in place.  A Pfannenstiel skin incision was made 2 cm above the pubic symphysis in the midline with the scalpel over the prior incision.  Dissection was carried down with the Bovie cautery until the fascia was reached. The fascia was incised in the midline. The incision was extended laterally with the Mayo scissors. The inferior aspect of the fascial incision was grasped with the Coker clamps, elevated up and the underlying rectus muscles were dissected off sharply. The superior aspect of the fascial incision was grasped with the Coker clamps elevated up and the underlying rectus muscles were dissected off sharply.  The peritoneum was entered sharply. The peritoneal incision was extended superiorly and inferiorly with good visualization of the bladder. The bladder blade was inserted and palpation was done to assess the fetal position and the location of the uterine vessels. The lower segment of the uterus was incised sharply with the scalpel and extended  bluntly in the cephalo-caudal fashion. The infant was grasped, brought to the incision,  rotated and the infant was delivered with fundal pressure. The nose and mouth were bulb suctioned. The cord was clamped and cut after 1 minute delay. The infant was handed off to the waiting pediatrician. The placenta was expressed. The uterus was exteriorized. The uterus was cleared of all clots and debris. The uterine incision was repaired with 0 Vicryl in a running locked fashion.  A second layer of the same suture was used in an imbricating fashion to obtain excellent hemostasis. 3 additional figure of 8 sutures were placed in the midline.  Desire for permanent sterilization was confirmed with pt. A packing was placed along the uterine incision.  Bilateral fallopian tubes were evaluated.  A Babcock was placed in the midportion of the right fallopian tube and used to elevate the tube.  The Bovie cautery was used to create a window in the mesosalpinx.  A Parlee Amescua clamp was placed through this window and used to clamp laterally along the mesosalpinx until just under the fimbriated end.  Bovie cautery was used to  transect this tubal segment.  A free tie of plain gut suture was placed under the Duncan Alejandro clamp to tie off the free pedicle.  A second free tie was placed along this pedicle under the first free tie.  A second Aditi Rovira clamp was placed from this window in the mesosalpinx tracking medially along the mesosalpinx and across the proximal fallopian tube just lateral to the cornua.  Bovie cautery was used to transect above the [place] clamp along the mesosalpinx and then to transect the tube.  A free tie was placed under the Arvine Clayburn clamp to tie off the free pedicle.  A second free tie was placed under the first for 2 ties on the pedicle.  Good hemostasis was noted.  An identical procedure was carried out on the contralateral side.  After the uterus was returned to the abdomen, and after the gutters were cleared, the pedicles were reevaluated and found to be hemostatic.   The uterus was then returned to the abdomen, the gutters were cleared of all clots and debris. The uterine incision was reinspected and found to be hemostatic. The peritoneum was grasped and closed with 2-0 Vicryl in a running fashion. The cut muscle edges and the underside of the fascia were inspected and found to be hemostatic. The fascia was closed with 0 Vicryl in a single layer . The subcutaneous tissue was irrigated. Scarpa's layer was closed with a 2-0 plain gut suture. The skin was closed with a 4-0 Monocryl in a single layer. The patient tolerated the procedure well. Sponge lap and needle counts were correct x3 and patient was taken to the recovery room in a stable condition.  Ala Dach 06/22/2019 3:59 PM

## 2019-06-22 NOTE — Anesthesia Procedure Notes (Signed)
Spinal  Patient location during procedure: OR Start time: 06/22/2019 2:44 PM End time: 06/22/2019 2:44 PM Staffing Performed: other anesthesia staff  Anesthesiologist: Janeece Riggers, MD Other anesthesia staff: Georgina Peer, RN Preanesthetic Checklist Completed: patient identified, IV checked, site marked, risks and benefits discussed, surgical consent, monitors and equipment checked, pre-op evaluation and timeout performed Spinal Block Patient position: sitting Prep: ChloraPrep Patient monitoring: heart rate, continuous pulse ox and blood pressure Approach: midline Location: L3-4 Injection technique: single-shot Needle Needle type: Pencan  Needle gauge: 24 G Needle length: 10 cm Assessment Sensory level: T4

## 2019-06-22 NOTE — Transfer of Care (Signed)
Immediate Anesthesia Transfer of Care Note  Patient: Kara Murphy  Procedure(s) Performed: Repeat CESAREAN SECTION WITH BILATERAL TUBAL LIGATION by Salpingectomy (Bilateral Abdomen)  Patient Location: PACU  Anesthesia Type:Spinal  Level of Consciousness: awake  Airway & Oxygen Therapy: Patient Spontanous Breathing  Post-op Assessment: Report given to RN  Post vital signs: Reviewed and stable  Last Vitals:  Vitals Value Taken Time  BP    Temp    Pulse    Resp    SpO2      Last Pain:  Vitals:   06/22/19 1217  TempSrc: Oral         Complications: No apparent anesthesia complications

## 2019-06-23 ENCOUNTER — Encounter (HOSPITAL_COMMUNITY): Payer: Self-pay | Admitting: Obstetrics

## 2019-06-23 LAB — CBC
HCT: 33.5 % — ABNORMAL LOW (ref 36.0–46.0)
Hemoglobin: 11.5 g/dL — ABNORMAL LOW (ref 12.0–15.0)
MCH: 29.9 pg (ref 26.0–34.0)
MCHC: 34.3 g/dL (ref 30.0–36.0)
MCV: 87.2 fL (ref 80.0–100.0)
Platelets: 205 10*3/uL (ref 150–400)
RBC: 3.84 MIL/uL — ABNORMAL LOW (ref 3.87–5.11)
RDW: 13.2 % (ref 11.5–15.5)
WBC: 16.3 10*3/uL — ABNORMAL HIGH (ref 4.0–10.5)
nRBC: 0 % (ref 0.0–0.2)

## 2019-06-23 LAB — COMPREHENSIVE METABOLIC PANEL
ALT: 12 U/L (ref 0–44)
AST: 22 U/L (ref 15–41)
Albumin: 2.3 g/dL — ABNORMAL LOW (ref 3.5–5.0)
Alkaline Phosphatase: 74 U/L (ref 38–126)
Anion gap: 8 (ref 5–15)
BUN: 6 mg/dL (ref 6–20)
CO2: 22 mmol/L (ref 22–32)
Calcium: 8.4 mg/dL — ABNORMAL LOW (ref 8.9–10.3)
Chloride: 107 mmol/L (ref 98–111)
Creatinine, Ser: 0.63 mg/dL (ref 0.44–1.00)
GFR calc Af Amer: >60 mL/min (ref >60)
GFR calc non Af Amer: >60 mL/min (ref >60)
Glucose, Bld: 104 mg/dL — ABNORMAL HIGH (ref 70–99)
Potassium: 3.6 mmol/L (ref 3.5–5.1)
Sodium: 137 mmol/L (ref 135–145)
Total Bilirubin: 0.4 mg/dL (ref 0.3–1.2)
Total Protein: 5 g/dL — ABNORMAL LOW (ref 6.5–8.1)

## 2019-06-23 LAB — BIRTH TISSUE RECOVERY COLLECTION (PLACENTA DONATION): Placenta donation bld collect: 12312020

## 2019-06-23 NOTE — Progress Notes (Signed)
POSTOPERATIVE DAY # 1 S/P Repeat LTCS with bilateral salpingectomy, baby boy "Ruthann Cancer"   S:         Reports feeling well, minimal discomfort              Tolerating po intake / no nausea / no vomiting / minimal flatus / no BM  Denies dizziness, SOB, or CP             Bleeding is light             Pain controlled with Toradol             Up ad lib / ambulatory/ voiding x1 since catheter removal   Newborn breast feeding - going well, latching well / Circumcision - planning    O:  VS: BP 140/87 (BP Location: Right Arm)   Pulse 82   Temp 98 F (36.7 C) (Oral)   Resp 18   Ht 5\' 2"  (1.575 m)   Wt 79.4 kg   SpO2 98%   Breastfeeding Unknown   BMI 32.01 kg/m  06/23/19 0715  98 F (36.7 C)  82  --  18  140/87  Sitting  98 %  --  --  --  -- KI   06/23/19 0300  97.7 F (36.5 C)  72  --  20  139/86  Semi-fowlers  --  --  --  --  -- [place]   06/22/19 2042  97.8 F (36.6 C)  --  --  18  --  --  99 %  --  --  --  -- [place]   06/22/19 1941  97.7 F (36.5 C)  61  --  20  149/86Abnormal   Semi-fowlers  99 %  --  --  --  -- [place]   06/22/19 1820  97.9 F (36.6 C)  67  --  18  143/96Abnormal   Lying  98 %  --  --  --  -- KI   06/22/19 1711  97.6 F (36.4 C)  75  --  18  147/91Abnormal   --  98 %  --  --  --  -- DB   06/22/19 1700  --  73  73  16  144/92Abnormal   --  99 %  --  --  --  -- DB   06/22/19 1645  --  77  76  17  134/94Abnormal   --  100 %  --  --  --  -- DB   06/22/19 1630  --  80  80  19  124/70  --  100 %  --  --  --  -- DB      LABS:               Recent Labs    06/23/19 0654  WBC 16.3*  HGB 11.5*  PLT 205               Bloodtype: --/--/O POS Performed at [facility], [place] [address] [place], [place] [address]  [phone])  Rubella: Immune (06/10 0000)                                             I&O: Intake/Output      12/30 0701 - 12/31 0700 12/31 0701 - 01/01 0700   I.V. (mL/kg) 3350.7 (42.2) 632.1 (8)  Total Intake(mL/kg) 3350.7 (42.2) 632.1 (8)   Urine  (mL/kg/hr) 1375 600 (2.3)   Blood 177    Total Output 1552 600   Net +1798.7 +32.1                     Physical Exam:             Alert and Oriented X3  Lungs: Clear and unlabored  Heart: regular rate and rhythm / no murmurs  Abdomen: soft, non-tender, mild gaseous distention, active bowel sounds in all quadrants              Fundus: firm, non-tender, U-2             Dressing: honeycomb with steri-strips - old drainage noted on right side of dressing, otherwise dry and intact              Incision:  approximated with sutures / no erythema / no ecchymosis / no drainage  Perineum: intact  Lochia: scant rubra   Extremities: no edema, no calf pain or tenderness,   A/P:     POD # 1 S/P Repeat LTCS with bilateral salpingectomy            Anxiety - on Prozac 20mg  daily   Elevated BPs on admission without dx of hypertension   - hx. Of severe PEC   - watch BPs, add CMP today    - no neural s/s or evidence of PEC  Routine postoperative care              Warm liquids and ambulation encouraged to promote bowel motility   Continue working with lactation   Continue current care, may consider early d/c home tomorrow  Lars Pinks, MSN, CNM [facility]

## 2019-06-23 NOTE — Lactation Note (Signed)
This note was copied from a baby's chart. Lactation Consultation Note  Patient Name: Kara Murphy Today's Date: 06/23/2019   [facility] visit attempted, but Mom in bathroom. LC to return.   Kara Murphy [facility] 06/23/2019, 11:53 AM

## 2019-06-23 NOTE — Lactation Note (Signed)
This note was copied from a baby's chart. Lactation Consultation Note  Patient Name: Kara Murphy [id number] Date: 06/23/2019   Infant is 60 hrs old. Mom is quite pleased with how well "Kara Murphy" is doing at the breast (her 1st child was born at 36 weeks & was in the NICU & never latched).  She has no discomfort with latch & she feels that he is swallowing when at the breast. Infant has already had 5 voids & 5 stools since birth.   Infant had just unlatched, content, as I entered room. Mom's nipple shape did not look distorted when infant released latch.  Mom has no questions at this time, but knows the nursing staff & LCs are available if she has any concerns.  Mom is on fluoxetine (L2).    Kara Murphy [facility] 06/23/2019, 12:31 PM

## 2019-06-24 DIAGNOSIS — Z412 Encounter for routine and ritual male circumcision: Secondary | ICD-10-CM | POA: Diagnosis not present

## 2019-06-24 MED ORDER — NIFEDIPINE ER OSMOTIC RELEASE 30 MG PO TB24
30.0000 mg | ORAL_TABLET | Freq: Every day | ORAL | Status: DC
  Administered 2019-06-24 – 2019-06-25 (×2): 30 mg via ORAL
  Filled 2019-06-24 (×2): qty 1

## 2019-06-24 NOTE — Lactation Note (Signed)
This note was copied from a baby's chart. Lactation Consultation Note  Patient Name: Kara Murphy Today's Date: 06/24/2019   Infant is 42 hrs old & is currently being circumcised. Mom says that nursing is going well & that infant is swallowing at breast. "Ruthann Cancer" has had excellent output: 10 voids & 9 stools since birth. Mom says her breasts feel fuller today.   Parents' questions about when to offer 1st bottle & when to start pumping to store milk were answered. Mom had an excellent supply with her 1st child. She could pump 8 oz/session & it took her 1 month to go through her milk storage after she chose to stop pumping.   Mom has no breastfeeding concerns at this time.   Mom was recently started on nifedipne.    Matthias Hughs [facility] 06/24/2019, 12:16 PM

## 2019-06-24 NOTE — Progress Notes (Signed)
Patient ID: Kara Murphy, female   DOB: June 27, 1980, 39 y.o.   MRN: [id number] Subjective: POD# 2 Live born female  Birth Weight: 7 lb 12.3 oz (3525 g) APGAR: 9, 9  Newborn Delivery   Birth date/time: 06/22/2019 15:12:00 Delivery type: C-Section, Low Transverse Trial of labor: No C-section categorization: Repeat     Baby name: Kara Murphy Delivering provider: Aloha Gell   circumcision planned Feeding: breast  Pain control at delivery: Spinal   Reports feeling well, denies s/sx preE  Patient reports tolerating PO.   Breast symptoms:+ colostrum Pain controlled with PO meds Denies HA/SOB/C/P/N/V/dizziness. Flatus present. She reports vaginal bleeding as normal, without clots.  She is ambulating, urinating without difficulty.     Objective:   VS:    Vitals:   06/23/19 1415 06/23/19 1500 06/23/19 2200 06/24/19 0610  BP: (!) 148/87  (!) 150/87 (!) 138/91  Pulse:   97 66  Resp:   18 18  Temp:  98.1 F (36.7 C) 98.4 F (36.9 C) 97.8 F (36.6 C)  TempSrc:  Oral Oral Oral  SpO2:   99% 100%  Weight:      Height:          Intake/Output Summary (Last 24 hours) at 06/24/2019 1001 Last data filed at 06/23/2019 1230 Gross per 24 hour  Intake --  Output 600 ml  Net -600 ml        Recent Labs    06/23/19 0654  WBC 16.3*  HGB 11.5*  HCT 33.5*  PLT 205   CMP Latest Ref Rng & Units 06/23/2019 05/14/2018 05/17/2016  Glucose 70 - 99 mg/dL 104(H) 86 87  BUN 6 - 20 mg/dL 6 15 17   Creatinine 0.44 - 1.00 mg/dL 0.63 0.65 0.71  Sodium 135 - 145 mmol/L 137 140 135  Potassium 3.5 - 5.1 mmol/L 3.6 4.1 3.7  Chloride 98 - 111 mmol/L 107 103 104  CO2 22 - 32 mmol/L 22 27 21(L)  Calcium 8.9 - 10.3 mg/dL 8.4(L) 9.7 8.4(L)  Total Protein 6.5 - 8.1 g/dL 5.0(L) 7.3 6.1(L)  Total Bilirubin 0.3 - 1.2 mg/dL 0.4 0.6 0.5  Alkaline Phos 38 - 126 U/L 74 47 80  AST 15 - 41 U/L 22 12 31   ALT 0 - 44 U/L 12 12 21       Blood type: --/--/O POS Performed at [facility]  [address], [place], [place] [address]  [phone])  Rubella: Immune (06/10 0000)     Physical Exam:  General: alert, cooperative and no distress Abdomen: soft, nontender, normal bowel sounds Incision: clean, dry and intact Uterine Fundus: firm, below umbilicus, nontender Lochia: minimal Ext: no edema, redness or tenderness in the calves or thighs      Assessment/Plan: 39 y.o.   POD# 2. [id number]                  Principal Problem:   Postpartum care following cesarean delivery (12/30) Active Problems:   Postpartum hypertension w/ hx severe PEC   Anxiety disorder stable on Prozac 20   Previous cesarean section   Doing well, stable.     BP mild range, labs stable, no neural sx Started Procardia 30 XL daily Continue inpatient for obs, daily weights           Advance diet as tolerated Encourage rest when baby rests Breastfeeding support Encourage to ambulate Routine post-op care  Juliene Pina, CNM, MSN 06/24/2019, 10:01 AM

## 2019-06-25 MED ORDER — IBUPROFEN 800 MG PO TABS: 800.0000 mg | ORAL_TABLET | Freq: Three times a day (TID) | ORAL | 1 refills | Status: DC

## 2019-06-25 MED ORDER — NIFEDIPINE ER 30 MG PO TB24: 30.0000 mg | ORAL_TABLET | Freq: Every day | ORAL | 1 refills | Status: DC

## 2019-06-25 MED ORDER — OXYCODONE-ACETAMINOPHEN 5-325 MG PO TABS: 1.0000 | ORAL_TABLET | ORAL | 0 refills | Status: DC | PRN

## 2019-06-25 NOTE — Lactation Note (Signed)
This note was copied from a baby's chart. Lactation Consultation Note  Patient Name: Kara Murphy [id number] Date: 06/25/2019 Reason for consult: Follow-up assessment Baby is 66 hours old/8% weight loss.  Mom reports baby is latching and feeding well.  Output good and breasts are feeling fuller.  Discussed milk coming to volume and the prevention and treatment of engorgement.  She has a breast pump at home.  Reviewed outpatient services and encouraged to call prn.  Maternal Data    Feeding Feeding Type: Breast Fed  LATCH Score                   Interventions    Lactation Tools Discussed/Used     Consult Status Consult Status: Complete Follow-up type: Call as needed    Ave Filter 06/25/2019, 9:40 AM

## 2019-06-25 NOTE — Progress Notes (Signed)
CSW received and acknowledges consult for EDPS of 9.  Consult screened out due to 9 on EDPS does not warrant a CSW consult.  MOB whom scores are greater than 9/yes to question 10 on Edinburgh Postpartum Depression Screen warrants a CSW consult. Please reconsult CSW if further concerns arise.  Elijio Miles, LCSW [facility] [employer] [phone]

## 2019-06-25 NOTE — Progress Notes (Signed)
CSW received phone call from [facility] stating infant has discharge orders in and MOB has a history of anxiety and depression.   * Referral screened out by Clinical Social Worker because none of the following criteria appear to apply:  ~ History of anxiety/depression during this pregnancy, or of post-partum depression following prior delivery. ~ Diagnosis of anxiety and/or depression within last 3 years OR * MOB's symptoms currently being treated with medication and/or therapy. MOB currently prescribed Prozac.  Please contact the Clinical Social Worker if needs arise or by MOB request.  Kara Miles, LCSW [facility] [place] [phone]

## 2019-06-25 NOTE — Discharge Summary (Signed)
OB Discharge Summary  Patient Name: Kara Murphy DOB: [birth year] MRN: [id number]  Date of admission: 06/22/2019 Delivering MD: Aloha Gell   Date of discharge: 06/25/2019  Admitting diagnosis: S/P repeat low transverse C-section [Z98.891] Previous cesarean section [Z98.891] Intrauterine pregnancy: [redacted]w[redacted]d     Secondary diagnosis:Principal Problem:   Postpartum care following cesarean delivery (12/30) Active Problems:   Postpartum hypertension   Anxiety disorder   Previous cesarean section  Additional problems: None      Discharge diagnosis: Term Pregnancy Delivered and Gestational Hypertension                                                                     Post partum procedures:None  Augmentation: Scheduled repeat cesarean section with bilateral salpingectomy  Complications: None  Hospital course:  Sceduled C/S   39 y.o. yo G2P1001 at [redacted]w[redacted]d was admitted to the hospital 06/22/2019 for scheduled cesarean section with the following indication:Elective Repeat.  Membrane Rupture Time/Date: 3:12 PM ,06/22/2019   Patient delivered a Viable infant.06/22/2019  Details of operation can be found in separate operative note.  Pateint had an uncomplicated postpartum course.  She is ambulating, tolerating a regular diet, passing flatus, and urinating well. Patient is discharged home in stable condition on  06/25/19      On postop day #3 patient was ambulating, voiding, passing flatus, breast-feeding well and had pain controlled with oral medication.  Patient had noted a right tender axillary mass that she first noted towards the end of the pregnancy and has grown since starting to breast-feed.  Of note patient had same mass when breast-feeding her last child.  With ice and compression over the last 24 hours the mass is less tender and less prominent.  Patient denies headache, vision change, right upper quadrant pain  Physical exam  Vitals:   06/24/19 2125 06/25/19 0520  06/25/19 0540 06/25/19 0942  BP: (!) 156/94 122/87  (!) 126/93  Pulse: 83 90  80  Resp:  16    Temp:  98 F (36.7 C)  98 F (36.7 C)  TempSrc:  Oral  Oral  SpO2:  100%  99%  Weight:   72.2 kg   Height:       General: alert, cooperative and no distress Lochia: appropriate Uterine Fundus: firm Incision: Healing well with no significant drainage DVT Evaluation: No evidence of DVT seen on physical exam. Abdomen exam: Nontender, no distention, no right upper quadrant pain or mass Lower extremity: 3+ DTR, no clonus, no edema Breasts: No engorgement.  Tender mobile nodular right axillary mass consistent with accessory breast tissue  Labs: Lab Results  Component Value Date   WBC 16.3 (H) 06/23/2019   HGB 11.5 (L) 06/23/2019   HCT 33.5 (L) 06/23/2019   MCV 87.2 06/23/2019   PLT 205 06/23/2019   CMP Latest Ref Rng & Units 06/23/2019  Glucose 70 - 99 mg/dL 104(H)  BUN 6 - 20 mg/dL 6  Creatinine 0.44 - 1.00 mg/dL 0.63  Sodium 135 - 145 mmol/L 137  Potassium 3.5 - 5.1 mmol/L 3.6  Chloride 98 - 111 mmol/L 107  CO2 22 - 32 mmol/L 22  Calcium 8.9 - 10.3 mg/dL 8.4(L)  Total Protein 6.5 - 8.1 g/dL 5.0(L)  Total Bilirubin 0.3 - 1.2 mg/dL 0.4  Alkaline Phos 38 - 126 U/L 74  AST 15 - 41 U/L 22  ALT 0 - 44 U/L 12    Discharge instruction: per After Visit Summary and "Baby and Me Booklet".  After Visit Meds:  Allergies as of 06/25/2019   No Known Allergies     Medication List    TAKE these medications   FLUoxetine 20 MG capsule Commonly known as: PROZAC Take 1 capsule (20 mg total) by mouth 2 (two) times daily.   hydrocortisone 2.5 % rectal cream Commonly known as: ANUSOL-HC Place 1 application rectally 2 (two) times daily.   ibuprofen 800 MG tablet Commonly known as: ADVIL Take 1 tablet (800 mg total) by mouth every 8 (eight) hours.   NIFEdipine 30 MG 24 hr tablet Commonly known as: ADALAT CC Take 1 tablet (30 mg total) by mouth daily. Start taking on: June 26, 2019    oxyCODONE-acetaminophen 5-325 MG tablet Commonly known as: PERCOCET/ROXICET Take 1-2 tablets by mouth every 4 (four) hours as needed for severe pain.       Diet: routine diet  Activity: Advance as tolerated. Pelvic rest for 6 weeks.   Outpatient follow up:1 week for blood pressure check Follow up Appt:No future appointments. Follow up visit: No follow-ups on file.  Postpartum contraception: Tubal Ligation  Newborn Data: Live born female  Birth Weight: 7 lb 12.3 oz (3525 g) APGAR: 44, 49  Newborn Delivery   Birth date/time: 06/22/2019 15:12:00 Delivery type: C-Section, Low Transverse Trial of labor: No C-section categorization: Repeat      Baby Feeding: Breast Disposition:home with mother   06/25/2019 Ala Dach, MD

## 2019-06-27 LAB — SURGICAL PATHOLOGY

## 2019-07-05 DIAGNOSIS — O135 Gestational [pregnancy-induced] hypertension without significant proteinuria, complicating the puerperium: Secondary | ICD-10-CM | POA: Diagnosis not present

## 2019-07-07 DIAGNOSIS — F411 Generalized anxiety disorder: Secondary | ICD-10-CM | POA: Diagnosis not present

## 2019-07-25 DIAGNOSIS — F411 Generalized anxiety disorder: Secondary | ICD-10-CM | POA: Diagnosis not present

## 2019-08-02 DIAGNOSIS — Z1151 Encounter for screening for human papillomavirus (HPV): Secondary | ICD-10-CM | POA: Diagnosis not present

## 2019-08-02 DIAGNOSIS — Z124 Encounter for screening for malignant neoplasm of cervix: Secondary | ICD-10-CM | POA: Diagnosis not present

## 2019-08-23 DIAGNOSIS — F411 Generalized anxiety disorder: Secondary | ICD-10-CM | POA: Diagnosis not present

## 2019-09-06 DIAGNOSIS — Z20822 Contact with and (suspected) exposure to covid-19: Secondary | ICD-10-CM | POA: Diagnosis not present

## 2019-09-06 DIAGNOSIS — R0981 Nasal congestion: Secondary | ICD-10-CM | POA: Diagnosis not present

## 2019-09-06 DIAGNOSIS — J029 Acute pharyngitis, unspecified: Secondary | ICD-10-CM | POA: Diagnosis not present

## 2019-09-15 DIAGNOSIS — F411 Generalized anxiety disorder: Secondary | ICD-10-CM | POA: Diagnosis not present

## 2019-10-13 DIAGNOSIS — F411 Generalized anxiety disorder: Secondary | ICD-10-CM | POA: Diagnosis not present

## 2019-10-20 ENCOUNTER — Other Ambulatory Visit: Payer: Self-pay

## 2019-10-20 ENCOUNTER — Ambulatory Visit (INDEPENDENT_AMBULATORY_CARE_PROVIDER_SITE_OTHER): Payer: BC Managed Care – PPO | Admitting: Family Medicine

## 2019-10-20 ENCOUNTER — Encounter: Payer: Self-pay | Admitting: Family Medicine

## 2019-10-20 VITALS — BP 102/78 | HR 90 | Temp 98.0°F | Ht 62.0 in | Wt 139.0 lb

## 2019-10-20 DIAGNOSIS — D1723 Benign lipomatous neoplasm of skin and subcutaneous tissue of right leg: Secondary | ICD-10-CM

## 2019-10-20 DIAGNOSIS — R590 Localized enlarged lymph nodes: Secondary | ICD-10-CM | POA: Diagnosis not present

## 2019-10-20 DIAGNOSIS — Z0000 Encounter for general adult medical examination without abnormal findings: Secondary | ICD-10-CM

## 2019-10-20 DIAGNOSIS — Z0001 Encounter for general adult medical examination with abnormal findings: Secondary | ICD-10-CM

## 2019-10-20 DIAGNOSIS — F411 Generalized anxiety disorder: Secondary | ICD-10-CM

## 2019-10-20 LAB — COMPREHENSIVE METABOLIC PANEL
ALT: 16 U/L (ref 0–35)
AST: 15 U/L (ref 0–37)
Albumin: 4.9 g/dL (ref 3.5–5.2)
Alkaline Phosphatase: 78 U/L (ref 39–117)
BUN: 14 mg/dL (ref 6–23)
CO2: 27 mEq/L (ref 19–32)
Calcium: 9.8 mg/dL (ref 8.4–10.5)
Chloride: 101 mEq/L (ref 96–112)
Creatinine, Ser: 0.67 mg/dL (ref 0.40–1.20)
GFR: 97.83 mL/min (ref >60.00)
Glucose, Bld: 91 mg/dL (ref 70–99)
Potassium: 4.3 mEq/L (ref 3.5–5.1)
Sodium: 137 mEq/L (ref 135–145)
Total Bilirubin: 0.9 mg/dL (ref 0.2–1.2)
Total Protein: 7.5 g/dL (ref 6.0–8.3)

## 2019-10-20 LAB — CBC WITH DIFFERENTIAL/PLATELET
Basophils Absolute: 0.1 10*3/uL (ref 0.0–0.1)
Basophils Relative: 0.6 % (ref 0.0–3.0)
Eosinophils Absolute: 0.2 10*3/uL (ref 0.0–0.7)
Eosinophils Relative: 2 % (ref 0.0–5.0)
HCT: 45 % (ref 36.0–46.0)
Hemoglobin: 15.3 g/dL — ABNORMAL HIGH (ref 12.0–15.0)
Lymphocytes Relative: 35 % (ref 12.0–46.0)
Lymphs Abs: 3.3 10*3/uL (ref 0.7–4.0)
MCHC: 34 g/dL (ref 30.0–36.0)
MCV: 89.6 fl (ref 78.0–100.0)
Monocytes Absolute: 0.7 10*3/uL (ref 0.1–1.0)
Monocytes Relative: 7.1 % (ref 3.0–12.0)
Neutro Abs: 5.2 10*3/uL (ref 1.4–7.7)
Neutrophils Relative %: 55.3 % (ref 43.0–77.0)
Platelets: 306 10*3/uL (ref 150.0–400.0)
RBC: 5.03 Mil/uL (ref 3.87–5.11)
RDW: 13.3 % (ref 11.5–15.5)
WBC: 9.4 10*3/uL (ref 4.0–10.5)

## 2019-10-20 LAB — LIPID PANEL
Cholesterol: 242 mg/dL — ABNORMAL HIGH (ref 0–200)
HDL: 67.9 mg/dL (ref >39.00)
LDL Cholesterol: 159 mg/dL — ABNORMAL HIGH (ref 0–99)
NonHDL: 174.03
Total CHOL/HDL Ratio: 4
Triglycerides: 77 mg/dL (ref 0.0–149.0)
VLDL: 15.4 mg/dL (ref 0.0–40.0)

## 2019-10-20 LAB — TSH: TSH: 1.85 u[IU]/mL (ref 0.35–4.50)

## 2019-10-20 NOTE — Patient Instructions (Signed)
Referral for surgeon for lipomas in right leg. They will call you with this.  Anxiety: if worse, would increase your prozac to '40mg'$ .  Let me know if you aren't doing well....   Preventive Care 43-39 Years Old, Female Preventive care refers to visits with your health care provider and lifestyle choices that can promote health and wellness. This includes:  A yearly physical exam. This may also be called an annual well check.  Regular dental visits and eye exams.  Immunizations.  Screening for certain conditions.  Healthy lifestyle choices, such as eating a healthy diet, getting regular exercise, not using drugs or products that contain nicotine and tobacco, and limiting alcohol use. What can I expect for my preventive care visit? Physical exam Your health care provider will check your:  Height and weight. This may be used to calculate body mass index (BMI), which tells if you are at a healthy weight.  Heart rate and blood pressure.  Skin for abnormal spots. Counseling Your health care provider may ask you questions about your:  Alcohol, tobacco, and drug use.  Emotional well-being.  Home and relationship well-being.  Sexual activity.  Eating habits.  Work and work Statistician.  Method of birth control.  Menstrual cycle.  Pregnancy history. What immunizations do I need?  Influenza (flu) vaccine  This is recommended every year. Tetanus, diphtheria, and pertussis (Tdap) vaccine  You may need a Td booster every 10 years. Varicella (chickenpox) vaccine  You may need this if you have not been vaccinated. Human papillomavirus (HPV) vaccine  If recommended by your health care provider, you may need three doses over 6 months. Measles, mumps, and rubella (MMR) vaccine  You may need at least one dose of MMR. You may also need a second dose. Meningococcal conjugate (MenACWY) vaccine  One dose is recommended if you are age 71-21 years and a first-year college student  living in a residence hall, or if you have one of several medical conditions. You may also need additional booster doses. Pneumococcal conjugate (PCV13) vaccine  You may need this if you have certain conditions and were not previously vaccinated. Pneumococcal polysaccharide (PPSV23) vaccine  You may need one or two doses if you smoke cigarettes or if you have certain conditions. Hepatitis A vaccine  You may need this if you have certain conditions or if you travel or work in places where you may be exposed to hepatitis A. Hepatitis B vaccine  You may need this if you have certain conditions or if you travel or work in places where you may be exposed to hepatitis B. Haemophilus influenzae type b (Hib) vaccine  You may need this if you have certain conditions. You may receive vaccines as individual doses or as more than one vaccine together in one shot (combination vaccines). Talk with your health care provider about the risks and benefits of combination vaccines. What tests do I need?  Blood tests  Lipid and cholesterol levels. These may be checked every 5 years starting at age 35.  Hepatitis C test.  Hepatitis B test. Screening  Diabetes screening. This is done by checking your blood sugar (glucose) after you have not eaten for a while (fasting).  Sexually transmitted disease (STD) testing.  BRCA-related cancer screening. This may be done if you have a family history of breast, ovarian, tubal, or peritoneal cancers.  Pelvic exam and Pap test. This may be done every 3 years starting at age 76. Starting at age 60, this may be done  every 5 years if you have a Pap test in combination with an HPV test. Talk with your health care provider about your test results, treatment options, and if necessary, the need for more tests. Follow these instructions at home: Eating and drinking   Eat a diet that includes fresh fruits and vegetables, whole grains, lean protein, and low-fat  dairy.  Take vitamin and mineral supplements as recommended by your health care provider.  Do not drink alcohol if: ? Your health care provider tells you not to drink. ? You are pregnant, may be pregnant, or are planning to become pregnant.  If you drink alcohol: ? Limit how much you have to 0-1 drink a day. ? Be aware of how much alcohol is in your drink. In the [place] one drink equals one 12 oz bottle of beer (355 mL), one 5 oz glass of wine (148 mL), or one 1 oz glass of hard liquor (44 mL). Lifestyle  Take daily care of your teeth and gums.  Stay active. Exercise for at least 30 minutes on 5 or more days each week.  Do not use any products that contain nicotine or tobacco, such as cigarettes, e-cigarettes, and chewing tobacco. If you need help quitting, ask your health care provider.  If you are sexually active, practice safe sex. Use a condom or other form of birth control (contraception) in order to prevent pregnancy and STIs (sexually transmitted infections). If you plan to become pregnant, see your health care provider for a preconception visit. What's next?  Visit your health care provider once a year for a well check visit.  Ask your health care provider how often you should have your eyes and teeth checked.  Stay up to date on all vaccines. This information is not intended to replace advice given to you by your health care provider. Make sure you discuss any questions you have with your health care provider. Document Revised: 02/18/2018 Document Reviewed: 02/18/2018 Elsevier Patient Education  2020 Elsevier Inc.  

## 2019-10-20 NOTE — Progress Notes (Signed)
Patient: Kara Murphy MRN: [id number] DOB: 03/27/1981 PCP: Orma Flaming, MD     Subjective:  Chief Complaint  Patient presents with  . Annual Exam  . Anxiety  . lump in right leg  . lump in right axillary    HPI: The patient is a 39 y.o. female who presents today for annual exam. She denies any changes to past medical history. There have been no recent hospitalizations. They are not following a well balanced diet and exercise plan. Weight has been decreasing steadily. Patient complains of a lump in he right leg. She also has some concerns about her milk ducts working properly, since giving birth.  No first degree relative with breast or colon cancer.   She is four months post partum. She is taking her PNV.   Anxiety On prozac 20mg /day. She is doing okay with this dosage. She has had increased anxiety recently with one of her close friends getting diagnosed with stage 4 lung cancer. She has some other close friends with cancer as well. She knows her anxiety is worse with illness. She also has her 52 month old baby. She is currently in counseling. She is not exercising like she should, but is trying to work on this. She is not having panic attacks. She does have obsessive thoughts at times.   She has 2 lumps on her right leg one on her calf and one on her right hamstring that she has had for years. No changes, but the one on her hamstring sometimes hurts her. She had one removed on the same leg years ago.    Immunization History  Administered Date(s) Administered  . Influenza,inj,Quad PF,6+ Mos 05/14/2018  . PFIZER SARS-COV-2 Vaccination 09/08/2019, 10/03/2019   Colonoscopy: Not of age Mammogram: Not of age-next year.  Pap smear: 06/2019. Wnl. Followed by [facility].    Review of Systems  Constitutional: Negative for chills, fatigue and fever.  HENT: Negative for dental problem, ear pain, hearing loss and trouble swallowing.   Eyes: Negative for visual disturbance.   Respiratory: Negative for cough, chest tightness, shortness of breath and wheezing.   Cardiovascular: Negative for chest pain, palpitations and leg swelling.  Gastrointestinal: Negative for abdominal pain, blood in stool, diarrhea, nausea and vomiting.  Endocrine: Negative for cold intolerance, polydipsia, polyphagia and polyuria.  Genitourinary: Negative for dysuria, frequency, hematuria, pelvic pain and urgency.  Musculoskeletal: Negative for arthralgias.  Skin: Negative for rash.  Neurological: Negative for dizziness and headaches.  Hematological: Positive for adenopathy (right axillary ).  Psychiatric/Behavioral: Negative for dysphoric mood and sleep disturbance. The patient is nervous/anxious.     Allergies Patient has No Known Allergies.  Past Medical History Patient  has a past medical history of Anxiety, Carpal tunnel syndrome on left, Depression, Genital warts, History of chicken pox, History of fainting spells of unknown cause, History of pre-eclampsia in prior pregnancy, currently pregnant, Hypertension, and Screening for HIV (human immunodeficiency virus).  Surgical History Patient  has a past surgical history that includes Cesarean section (N/A, 05/13/2016); Knee surgery (Right, 1999); and Cesarean section with bilateral tubal ligation (Bilateral, 06/22/2019).  Family History Pateint's family history includes Alzheimer's disease in her maternal grandmother; Arthritis in her maternal grandfather and maternal grandmother; Cancer in her paternal grandfather and paternal grandmother; Depression in her father and mother; Drug abuse in her father; Heart attack in her paternal grandfather; Heart disease in her father; Hypertension in her mother; Mental illness in her father; Mental retardation in her father and sister; Stroke in  her maternal grandfather.  Social History Patient  reports that she has quit smoking. Her smoking use included cigarettes. She has never used smokeless  tobacco. She reports current alcohol use. She reports that she does not use drugs.    Objective: Vitals:   10/20/19 1100  BP: 102/78  Pulse: 90  Temp: 98 F (36.7 C)  TempSrc: Temporal  SpO2: 98%  Weight: 139 lb (63 kg)  Height: 5\' 2"  (1.575 m)    Body mass index is 25.42 kg/m.  Physical Exam Vitals reviewed.  Constitutional:      Appearance: Normal appearance. She is well-developed and normal weight.  HENT:     Head: Normocephalic and atraumatic.     Right Ear: External ear normal. There is impacted cerumen.     Left Ear: External ear normal. There is impacted cerumen.     Mouth/Throat:     Mouth: Mucous membranes are moist.  Eyes:     Extraocular Movements: Extraocular movements intact.     Conjunctiva/sclera: Conjunctivae normal.     Pupils: Pupils are equal, round, and reactive to light.  Neck:     Thyroid: No thyromegaly.  Cardiovascular:     Rate and Rhythm: Normal rate and regular rhythm.     Pulses: Normal pulses.     Heart sounds: Normal heart sounds. No murmur.  Pulmonary:     Effort: Pulmonary effort is normal.     Breath sounds: Normal breath sounds.  Abdominal:     General: Abdomen is flat. Bowel sounds are normal. There is no distension.     Palpations: Abdomen is soft.     Tenderness: There is no abdominal tenderness.  Musculoskeletal:     Cervical back: Normal range of motion and neck supple.  Lymphadenopathy:     Cervical: No cervical adenopathy.     Upper Body:     Right upper body: Axillary adenopathy present.  Skin:    General: Skin is warm and dry.     Capillary Refill: Capillary refill takes less than 2 seconds.     Findings: No rash.     Comments: 2 mobile masses in right leg.  1) right hamstring. Non tender, mobile. Large.  2) right calf. Non tender, mobile, round. About 2-3cm in diameter. Deeper on palpation   Neurological:     General: No focal deficit present.     Mental Status: She is alert and oriented to person, place, and  time.     Cranial Nerves: No cranial nerve deficit.     Coordination: Coordination normal.     Deep Tendon Reflexes: Reflexes normal.  Psychiatric:        Mood and Affect: Mood normal.        Behavior: Behavior normal.        GAD 7 : Generalized Anxiety Score 10/20/2019 05/14/2018  Nervous, Anxious, on Edge 2 1  Control/stop worrying 2 1  Worry too much - different things 2 0  Trouble relaxing 3 1  Restless 0 0  Easily annoyed or irritable 2 1  Afraid - awful might happen 2 2  Total GAD 7 Score 13 6  Anxiety Difficulty Not difficult at all Somewhat difficult     Office Visit from 10/20/2019 in [facility]  PHQ-2 Total Score  0       Assessment/plan: 1. Annual physical exam Routine lab work today. HM is UTD. Doing well and no concern for post partum depression. Exercise, breast feeding. Continue PNV. F/u in  one year or as needed.  Patient counseling [x]    Nutrition: Stressed importance of moderation in sodium/caffeine intake, saturated fat and cholesterol, caloric balance, sufficient intake of fresh fruits, vegetables, fiber, calcium, iron, and 1 mg of folate supplement per day (for females capable of pregnancy).  [x]    Stressed the importance of regular exercise.   []    Substance Abuse: Discussed cessation/primary prevention of tobacco, alcohol, or other drug use; driving or other dangerous activities under the influence; availability of treatment for abuse.   [x]    Injury prevention: Discussed safety belts, safety helmets, smoke detector, smoking near bedding or upholstery.   [x]    Sexuality: Discussed sexually transmitted diseases, partner selection, use of condoms, avoidance of unintended pregnancy  and contraceptive alternatives.  [x]    Dental health: Discussed importance of regular tooth brushing, flossing, and dental visits.  [x]    Health maintenance and immunizations reviewed. Please refer to Health maintenance section.     2. Generalized anxiety  disorder GAD7 elevated. Continue counseling. Recommended exercise. She is very in tune with what she needs. Has had a lot of stressors lately with close friends having cancer illness. Recommended if she feels like her anxiety is still getting worse to increase her prozac to 40mg . Her counselor can also watch this as well. No panic attacks, but she is to let me know if she feels like her symptoms are getting worse.   3. Lipoma of right lower extremity Referral to general surgery   4) axillary lymphadenopathy Secondary to breast feeding and engorged breasts. Let me know if continues to persists beyond 6 months.    This visit occurred during the SARS-CoV-2 public health emergency.  Safety protocols were in place, including screening questions prior to the visit, additional usage of staff PPE, and extensive cleaning of exam room while observing appropriate contact time as indicated for disinfecting solutions.    Return if symptoms worsen or fail to improve.     Orma Flaming, MD [place]  10/20/2019

## 2019-11-03 DIAGNOSIS — F411 Generalized anxiety disorder: Secondary | ICD-10-CM | POA: Diagnosis not present

## 2019-11-10 ENCOUNTER — Telehealth: Payer: Self-pay

## 2019-11-10 NOTE — Telephone Encounter (Signed)
Patient states that she never received her lab results and would like to know her results.

## 2019-11-10 NOTE — Telephone Encounter (Signed)
I spoke with pt to give lab result message. Pt verbalized understanding. She was very pleasant and understanding of delay on result call. No questions or concerns at this time.

## 2019-11-10 NOTE — Telephone Encounter (Signed)
These were resulted on 4/30. Please see lab note and let her know we are very sorry. Not sure what happened.  Dr. Rogers Blocker

## 2019-12-01 DIAGNOSIS — F411 Generalized anxiety disorder: Secondary | ICD-10-CM | POA: Diagnosis not present

## 2019-12-19 DIAGNOSIS — F411 Generalized anxiety disorder: Secondary | ICD-10-CM | POA: Diagnosis not present

## 2020-01-13 DIAGNOSIS — F411 Generalized anxiety disorder: Secondary | ICD-10-CM | POA: Diagnosis not present

## 2020-02-20 DIAGNOSIS — F411 Generalized anxiety disorder: Secondary | ICD-10-CM | POA: Diagnosis not present

## 2020-03-20 DIAGNOSIS — F411 Generalized anxiety disorder: Secondary | ICD-10-CM | POA: Diagnosis not present

## 2020-03-26 DIAGNOSIS — Z01419 Encounter for gynecological examination (general) (routine) without abnormal findings: Secondary | ICD-10-CM | POA: Diagnosis not present

## 2020-03-26 DIAGNOSIS — Z6825 Body mass index (BMI) 25.0-25.9, adult: Secondary | ICD-10-CM | POA: Diagnosis not present

## 2020-05-11 DIAGNOSIS — D1723 Benign lipomatous neoplasm of skin and subcutaneous tissue of right leg: Secondary | ICD-10-CM | POA: Diagnosis not present

## 2020-05-16 ENCOUNTER — Other Ambulatory Visit: Payer: Self-pay | Admitting: General Surgery

## 2020-05-16 DIAGNOSIS — D1723 Benign lipomatous neoplasm of skin and subcutaneous tissue of right leg: Secondary | ICD-10-CM

## 2020-06-04 ENCOUNTER — Ambulatory Visit
Admission: RE | Admit: 2020-06-04 | Discharge: 2020-06-04 | Disposition: A | Discharge status: Home / Self Care | Payer: BC Managed Care – PPO | Source: Ambulatory Visit | Attending: General Surgery | Admitting: General Surgery

## 2020-06-04 DIAGNOSIS — R2241 Localized swelling, mass and lump, right lower limb: Secondary | ICD-10-CM | POA: Diagnosis not present

## 2020-06-04 DIAGNOSIS — D1723 Benign lipomatous neoplasm of skin and subcutaneous tissue of right leg: Secondary | ICD-10-CM

## 2020-06-08 ENCOUNTER — Other Ambulatory Visit: Payer: Self-pay | Admitting: General Surgery

## 2020-06-08 DIAGNOSIS — D1723 Benign lipomatous neoplasm of skin and subcutaneous tissue of right leg: Secondary | ICD-10-CM

## 2020-07-05 ENCOUNTER — Ambulatory Visit
Admission: RE | Admit: 2020-07-05 | Discharge: 2020-07-05 | Disposition: A | Discharge status: Home / Self Care | Payer: 59 | Source: Ambulatory Visit | Attending: General Surgery | Admitting: General Surgery

## 2020-07-05 DIAGNOSIS — D1723 Benign lipomatous neoplasm of skin and subcutaneous tissue of right leg: Secondary | ICD-10-CM

## 2020-07-05 MED ORDER — GADOBENATE DIMEGLUMINE 529 MG/ML IV SOLN
12.0000 mL | Freq: Once | INTRAVENOUS | Status: AC | PRN
  Administered 2020-07-05: 13 mL via INTRAVENOUS

## 2020-08-09 ENCOUNTER — Encounter: Payer: Self-pay | Admitting: Orthopaedic Surgery

## 2020-08-09 ENCOUNTER — Encounter: Payer: Self-pay | Admitting: Family Medicine

## 2020-08-09 ENCOUNTER — Ambulatory Visit (INDEPENDENT_AMBULATORY_CARE_PROVIDER_SITE_OTHER): Payer: 59 | Admitting: Orthopaedic Surgery

## 2020-08-09 DIAGNOSIS — R2241 Localized swelling, mass and lump, right lower limb: Secondary | ICD-10-CM | POA: Insufficient documentation

## 2020-08-09 NOTE — Progress Notes (Signed)
Office Visit Note   Patient: Kara Murphy           Date of Birth: 26-Oct-1980           MRN: [id number] Visit Date: 08/09/2020              Requested by: Orma Flaming, [address] [place],  [place] [address] PCP: Orma Flaming, MD   Assessment & Plan: Visit Diagnoses:  1. 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  I would like to send her to Dr. Sullivan Lone at [facility] to further evaluate and treat these right thigh masses given their location especially the deeper distal one that is closer to the metaphyseal region.  I did give the patient reassurance that these are likely benign given her basically being asymptomatic other than feeling one of the masses in her thigh area that is more superficial.  She agrees with this referral as well.  She does now live in [place] but still gets her health care in [place] and works in [place].  All questions and concerns were answered and addressed.  We will work on making that referral.  Follow-Up Instructions: Return if symptoms worsen or fail to improve.   Orders:  No orders of the defined types were placed in this encounter.  No orders of the defined types were placed in this encounter.     Procedures: No procedures performed   Clinical Data: No additional findings.   Subjective: Chief Complaint  Patient presents with  . 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  She is referred to me from Dr. Greer Pickerel in general surgery to evaluate a right thigh mass.  She has had a recent MRI of her right thigh and it does show 2 different masses that are consistent potentially with peripheral nerve sheath tumors.  She has not really been any symptomatic from these and has noticed one of the masses for about 5 years.   She feels like it may have gotten bigger.  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  She does not have any pain that wakes her up at night.  She has not had any weight loss or other change in her medical status.  She denies numbness and tingling or weakness in her right lower extremity.  She is a very active individual.  I do have the MRI to review today and a high will go over the MRI with her.  HPI  Review of Systems There is currently listed no headache, chest pain, shortness of breath, fever, chills, nausea, vomiting  Objective: Vital Signs: There were no vitals taken for this visit.  Physical Exam She is alert and orient x3 and in no acute distress Ortho Exam She is a petite individual.  It is difficult to palpate but she can palpate a small mass in the mid thigh area just medial to the midline.  I cannot palpate any other mass in that leg.  There is no skin changes or no change in the size of her thighs when you compare them together.  She has 5 out of 5 strength of all muscle groups in her bilateral lower extremities and normal sensation as well. Specialty Comments:  No specialty comments available.  Imaging: No results found. I shared the MRI with the patient and we went over it in detail showing the 2 lesions one in the more superficial area of the mid thigh and one deeper toward the metaphyseal section just laterally and posterior of the femur.  Both of these have a slight heterogeneous signal.  PMFS History: Patient Active Problem List   Diagnosis Date Noted  . Anxiety disorder 05/14/2018  . Postpartum hypertension 05/11/2016   Past Medical History:  Diagnosis Date  . Anxiety   . Carpal tunnel syndrome on left   . Depression   . Genital warts   . History of chicken pox   . History of fainting spells  of unknown cause   . History of pre-eclampsia in prior pregnancy, currently pregnant   . Hypertension   . Screening for HIV (human immunodeficiency virus)     Family History  Problem Relation Age of Onset  . Depression Mother   . Hypertension Mother   . Depression Father   . Drug abuse Father   . Heart disease Father   . Mental illness Father   . Mental retardation Father   . Mental retardation Sister   . Arthritis Maternal Grandmother   . Alzheimer's disease Maternal Grandmother   . Arthritis Maternal Grandfather   . Stroke Maternal Grandfather   . Cancer Paternal Grandmother   . Cancer Paternal Grandfather   . Heart attack Paternal Grandfather     Past Surgical History:  Procedure Laterality Date  . CESAREAN SECTION N/A 05/13/2016   Procedure: CESAREAN SECTION;  Surgeon: Aloha Gell, MD;  Location: [facility];  Service: Obstetrics;  Laterality: N/A;  . CESAREAN SECTION WITH BILATERAL TUBAL LIGATION Bilateral 06/22/2019   Procedure: Repeat CESAREAN SECTION WITH BILATERAL TUBAL LIGATION by Salpingectomy;  Surgeon: Aloha Gell, MD;  Location: [facility];  Service: Obstetrics;  Laterality: Bilateral;  EDD: 06/29/19  . KNEE SURGERY Right 1999   Social History   Occupational History  . Not on file  Tobacco Use  . Smoking status: Former Smoker    Types: Cigarettes  . Smokeless tobacco: Never Used  Vaping Use  . Vaping Use: Never used  Substance and Sexual Activity  . Alcohol use: Yes  . Drug use: Never  . Sexual activity: Yes

## 2020-08-10 ENCOUNTER — Other Ambulatory Visit: Payer: Self-pay

## 2020-08-10 DIAGNOSIS — R2241 Localized swelling, mass and lump, right lower limb: Secondary | ICD-10-CM

## 2020-08-16 ENCOUNTER — Telehealth: Payer: Self-pay

## 2020-08-16 NOTE — Telephone Encounter (Signed)
Pt was referred to Dr. Mylo Red by [facility] ( which is where Dr. Rogers Blocker sent her). Dr Melvyn Neth office called and said they would be out of network for the pt and asked if they should proceed with the referral or if we could find pt an in network provider.

## 2020-10-25 ENCOUNTER — Ambulatory Visit (INDEPENDENT_AMBULATORY_CARE_PROVIDER_SITE_OTHER): Payer: 59 | Admitting: Family Medicine

## 2020-10-25 ENCOUNTER — Encounter: Payer: Self-pay | Admitting: Family Medicine

## 2020-10-25 ENCOUNTER — Other Ambulatory Visit: Payer: Self-pay

## 2020-10-25 VITALS — BP 128/88 | HR 99 | Temp 98.1°F | Ht 62.0 in | Wt 143.2 lb

## 2020-10-25 DIAGNOSIS — Z0000 Encounter for general adult medical examination without abnormal findings: Secondary | ICD-10-CM | POA: Diagnosis not present

## 2020-10-25 DIAGNOSIS — Z1159 Encounter for screening for other viral diseases: Secondary | ICD-10-CM

## 2020-10-25 DIAGNOSIS — F411 Generalized anxiety disorder: Secondary | ICD-10-CM

## 2020-10-25 DIAGNOSIS — R2231 Localized swelling, mass and lump, right upper limb: Secondary | ICD-10-CM | POA: Diagnosis not present

## 2020-10-25 LAB — CBC WITH DIFFERENTIAL/PLATELET
Basophils Absolute: 0.1 10*3/uL (ref 0.0–0.1)
Basophils Relative: 0.6 % (ref 0.0–3.0)
Eosinophils Absolute: 0.1 10*3/uL (ref 0.0–0.7)
Eosinophils Relative: 0.6 % (ref 0.0–5.0)
HCT: 41.3 % (ref 36.0–46.0)
Hemoglobin: 14.1 g/dL (ref 12.0–15.0)
Lymphocytes Relative: 33.3 % (ref 12.0–46.0)
Lymphs Abs: 2.8 10*3/uL (ref 0.7–4.0)
MCHC: 34.2 g/dL (ref 30.0–36.0)
MCV: 88.5 fl (ref 78.0–100.0)
Monocytes Absolute: 0.6 10*3/uL (ref 0.1–1.0)
Monocytes Relative: 7.8 % (ref 3.0–12.0)
Neutro Abs: 4.8 10*3/uL (ref 1.4–7.7)
Neutrophils Relative %: 57.7 % (ref 43.0–77.0)
Platelets: 274 10*3/uL (ref 150.0–400.0)
RBC: 4.67 Mil/uL (ref 3.87–5.11)
RDW: 12.5 % (ref 11.5–15.5)
WBC: 8.3 10*3/uL (ref 4.0–10.5)

## 2020-10-25 LAB — COMPREHENSIVE METABOLIC PANEL
ALT: 12 U/L (ref 0–35)
AST: 12 U/L (ref 0–37)
Albumin: 4.5 g/dL (ref 3.5–5.2)
Alkaline Phosphatase: 54 U/L (ref 39–117)
BUN: 12 mg/dL (ref 6–23)
CO2: 26 mEq/L (ref 19–32)
Calcium: 10 mg/dL (ref 8.4–10.5)
Chloride: 102 mEq/L (ref 96–112)
Creatinine, Ser: 0.67 mg/dL (ref 0.40–1.20)
GFR: 109.4 mL/min (ref >60.00)
Glucose, Bld: 89 mg/dL (ref 70–99)
Potassium: 3.9 mEq/L (ref 3.5–5.1)
Sodium: 137 mEq/L (ref 135–145)
Total Bilirubin: 0.8 mg/dL (ref 0.2–1.2)
Total Protein: 7.2 g/dL (ref 6.0–8.3)

## 2020-10-25 LAB — TSH: TSH: 1.48 u[IU]/mL (ref 0.35–4.50)

## 2020-10-25 LAB — LIPID PANEL
Cholesterol: 196 mg/dL (ref 0–200)
HDL: 59 mg/dL (ref >39.00)
LDL Cholesterol: 99 mg/dL (ref 0–99)
NonHDL: 137.08
Total CHOL/HDL Ratio: 3
Triglycerides: 188 mg/dL — ABNORMAL HIGH (ref 0.0–149.0)
VLDL: 37.6 mg/dL (ref 0.0–40.0)

## 2020-10-25 NOTE — Patient Instructions (Addendum)
-ultrasound ordered for axillary. I think it's fine, but let's check.  -you look great! Thanks for letting me take care of you! 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 Keep up the good work !  Preventive Care 60-40 Years Old, Female Preventive care refers to lifestyle choices and visits with your health care provider that can promote health and wellness. This includes:  A yearly physical exam. This is also called an annual wellness visit.  Regular dental and eye exams.  Immunizations.  Screening for certain conditions.  Healthy lifestyle choices, such as: ? Eating a healthy diet. ? Getting regular exercise. ? Not using drugs or products that contain nicotine and tobacco. ? Limiting alcohol use. What can I expect for my preventive care visit? Physical exam Your health care provider will check your:  Height and weight. These may be used to calculate your BMI (body mass index). BMI is a measurement that tells if you are at a healthy weight.  Heart rate and blood pressure.  Body temperature.  Skin for abnormal spots. Counseling Your health care provider may ask you questions about your:  Past medical problems.  Family's medical history.  Alcohol, tobacco, and drug use.  Emotional well-being.  Home life and relationship well-being.  Sexual activity.  Diet, exercise, and sleep habits.  Work and work Statistician.  Access to firearms.  Method of birth control.  Menstrual cycle.  Pregnancy history. What immunizations do I need? Vaccines are usually given at various ages, according to a schedule. Your health care provider will recommend vaccines for you based on your age, medical history, and lifestyle or other factors, such as travel or where you work.   What tests do I need? Blood tests  Lipid and cholesterol levels. These may be checked every 5 years, or more often if you are over 16 years old.  Hepatitis C test.  Hepatitis B test. Screening  Lung cancer  screening. You may have this screening every year starting at age 47 if you have a 30-pack-year history of smoking and currently smoke or have quit within the past 15 years.  Colorectal cancer screening. ? All adults should have this screening starting at age 64 and continuing until age 42. ? Your health care provider may recommend screening at age 73 if you are at increased risk. ? You will have tests every 1-10 years, depending on your results and the type of screening test.  Diabetes screening. ? This is done by checking your blood sugar (glucose) after you have not eaten for a while (fasting). ? You may have this done every 1-3 years.  Mammogram. ? This may be done every 1-2 years. ? Talk with your health care provider about when you should start having regular mammograms. This may depend on whether you have a family history of breast cancer.  BRCA-related cancer screening. This may be done if you have a family history of breast, ovarian, tubal, or peritoneal cancers.  Pelvic exam and Pap test. ? This may be done every 3 years starting at age 39. ? Starting at age 40, this may be done every 5 years if you have a Pap test in combination with an HPV test. Other tests  STD (sexually transmitted disease) testing, if you are at risk.  Bone density scan. This is done to screen for osteoporosis. You may have this scan if you are at high risk for osteoporosis. Talk with your health care provider about your test results, treatment options, and if necessary,  the need for more tests. Follow these instructions at home: Eating and drinking  Eat a diet that includes fresh fruits and vegetables, whole grains, lean protein, and low-fat dairy products.  Take vitamin and mineral supplements as recommended by your health care provider.  Do not drink alcohol if: ? Your health care provider tells you not to drink. ? You are pregnant, may be pregnant, or are planning to become pregnant.  If you  drink alcohol: ? Limit how much you have to 0-1 drink a day. ? Be aware of how much alcohol is in your drink. In the [place] one drink equals one 12 oz bottle of beer (355 mL), one 5 oz glass of wine (148 mL), or one 1 oz glass of hard liquor (44 mL).   Lifestyle  Take daily care of your teeth and gums. Brush your teeth every morning and night with fluoride toothpaste. Floss one time each day.  Stay active. Exercise for at least 30 minutes 5 or more days each week.  Do not use any products that contain nicotine or tobacco, such as cigarettes, e-cigarettes, and chewing tobacco. If you need help quitting, ask your health care provider.  Do not use drugs.  If you are sexually active, practice safe sex. Use a condom or other form of protection to prevent STIs (sexually transmitted infections).  If you do not wish to become pregnant, use a form of birth control. If you plan to become pregnant, see your health care provider for a prepregnancy visit.  If told by your health care provider, take low-dose aspirin daily starting at age 40.  Find healthy ways to cope with stress, such as: ? Meditation, yoga, or listening to music. ? Journaling. ? Talking to a trusted person. ? Spending time with friends and family. Safety  Always wear your seat belt while driving or riding in a vehicle.  Do not drive: ? If you have been drinking alcohol. Do not ride with someone who has been drinking. ? When you are tired or distracted. ? While texting.  Wear a helmet and other protective equipment during sports activities.  If you have firearms in your house, make sure you follow all gun safety procedures. What's next?  Visit your health care provider once a year for an annual wellness visit.  Ask your health care provider how often you should have your eyes and teeth checked.  Stay up to date on all vaccines. This information is not intended to replace advice given to you by your health care provider.  Make sure you discuss any questions you have with your health care provider. Document Revised: 03/13/2020 Document Reviewed: 02/18/2018 Elsevier Patient Education  2021 [employer].

## 2020-10-25 NOTE — Progress Notes (Signed)
Patient: Kara Murphy MRN: [id number] DOB: 1981/03/13 PCP: Orma Flaming, MD     Subjective:  Chief Complaint  Patient presents with  . Annual Exam  . Anxiety    HPI: The patient is a 40 y.o. female who presents today for annual exam. She denies any changes to past medical history. There have been no recent hospitalizations. She is not following a well balanced diet and exercise plan. Weight has been stable. She says that she has not been able to lose weight like she wants to, but has not changed any habits.   No family hx of colon cancer or breast cancer. Paternal grandmother had breast cancer.   GAD Currently on prozac 20mg /day. Given by her gyn. Very well controlled. Does not need refills.   Immunization History  Administered Date(s) Administered  . Influenza,inj,Quad PF,6+ Mos 05/14/2018  . PFIZER(Purple Top)SARS-COV-2 Vaccination 09/08/2019, 10/03/2019   Colonoscopy: routine screening at age 65 years.  Mammogram: due for this, about 6 months out from breast feeding.  Pap smear: 07/13/2019  Review of Systems  Constitutional: Negative for appetite change, chills, fatigue and fever.  HENT: Negative for dental problem, ear pain, hearing loss and trouble swallowing.   Eyes: Negative for visual disturbance.  Respiratory: Negative for cough, chest tightness and shortness of breath.   Cardiovascular: Negative for chest pain, palpitations and leg swelling.  Gastrointestinal: Negative for abdominal pain, blood in stool, diarrhea and nausea.  Endocrine: Negative for cold intolerance, polydipsia, polyphagia and polyuria.  Genitourinary: Negative for dysuria, frequency, hematuria and urgency.  Musculoskeletal: Negative for arthralgias.  Skin: Negative for rash.  Neurological: Negative for dizziness and headaches.  Psychiatric/Behavioral: Negative for dysphoric mood and sleep disturbance. The patient is not nervous/anxious.     Allergies Patient has No Known Allergies.  Past  Medical History Patient  has a past medical history of Anxiety, Carpal tunnel syndrome on left, Depression, Genital warts, History of chicken pox, History of fainting spells of unknown cause, History of pre-eclampsia in prior pregnancy, currently pregnant, Hypertension, and Screening for HIV (human immunodeficiency virus).  Surgical History Patient  has a past surgical history that includes Cesarean section (N/A, 05/13/2016); Knee surgery (Right, 1999); and Cesarean section with bilateral tubal ligation (Bilateral, 06/22/2019).  Family History Pateint's family history includes Alzheimer's disease in her maternal grandmother; Arthritis in her maternal grandfather and maternal grandmother; Cancer in her paternal grandfather and paternal grandmother; Depression in her father and mother; Drug abuse in her father; Heart attack in her paternal grandfather; Heart disease in her father; Hypertension in her mother; Mental illness in her father; Mental retardation in her father and sister; Stroke in her maternal grandfather.  Social History Patient  reports that she has quit smoking. Her smoking use included cigarettes. She has never used smokeless tobacco. She reports current alcohol use. She reports that she does not use drugs.    Objective: Vitals:   10/25/20 1123  BP: 128/88  Pulse: 99  Temp: 98.1 F (36.7 C)  TempSrc: Temporal  SpO2: 98%  Weight: 143 lb 3.2 oz (65 kg)  Height: 5\' 2"  (1.575 m)    Body mass index is 26.19 kg/m.  Physical Exam Vitals reviewed.  Constitutional:      Appearance: Normal appearance. She is well-developed and normal weight.  HENT:     Head: Normocephalic and atraumatic.     Right Ear: Tympanic membrane, ear canal and external ear normal.     Left Ear: Tympanic membrane, ear canal and external ear normal.  Nose: Nose normal.     Mouth/Throat:     Mouth: Mucous membranes are moist.  Eyes:     Extraocular Movements: Extraocular movements intact.      Conjunctiva/sclera: Conjunctivae normal.     Pupils: Pupils are equal, round, and reactive to light.  Neck:     Thyroid: No thyromegaly.  Cardiovascular:     Rate and Rhythm: Normal rate and regular rhythm.     Pulses: Normal pulses.     Heart sounds: Normal heart sounds. No murmur heard.   Pulmonary:     Effort: Pulmonary effort is normal.     Breath sounds: Normal breath sounds.  Chest:  Breasts:     Right: Axillary adenopathy present.    Abdominal:     General: Abdomen is flat. Bowel sounds are normal. There is no distension.     Palpations: Abdomen is soft.     Tenderness: There is no abdominal tenderness.  Musculoskeletal:     Cervical back: Normal range of motion and neck supple.  Lymphadenopathy:     Cervical: No cervical adenopathy.     Upper Body:     Right upper body: Axillary adenopathy present.     Comments: Flat, less than 2cm. Mobile   Skin:    General: Skin is warm and dry.     Capillary Refill: Capillary refill takes less than 2 seconds.     Findings: No rash.  Neurological:     General: No focal deficit present.     Mental Status: She is alert and oriented to person, place, and time.     Cranial Nerves: No cranial nerve deficit.     Coordination: Coordination normal.     Deep Tendon Reflexes: Reflexes normal.  Psychiatric:        Mood and Affect: Mood normal.        Behavior: Behavior normal.    GAD 7 : Generalized Anxiety Score 10/25/2020 10/20/2019 05/14/2018  Nervous, Anxious, on Edge 1 2 1   Control/stop worrying 0 2 1  Worry too much - different things 0 2 0  Trouble relaxing 1 3 1   Restless 0 0 0  Easily annoyed or irritable 2 2 1   Afraid - awful might happen 1 2 2   Total GAD 7 Score 5 13 6   Anxiety Difficulty Somewhat difficult Not difficult at all Somewhat difficult         Assessment/plan: 1. Annual physical exam HM reviewed and UTD. Labs today, she is not fasting. Exercising, healthy doing wel. 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 Very proud of her. Will f[facility]. (followed by ortho) see back in one year.  Patient counseling [x]    Nutrition: Stressed importance of moderation in sodium/caffeine intake, saturated fat and cholesterol, caloric balance, sufficient intake of fresh fruits, vegetables, fiber, calcium, iron, and 1 mg of folate supplement per day (for females capable of pregnancy).  [x]    Stressed the importance of regular exercise.   []    Substance Abuse: Discussed cessation/primary prevention of tobacco, alcohol, or other drug use; driving or other dangerous activities under the influence; availability of treatment for abuse.   [x]    Injury prevention: Discussed safety belts, safety helmets, smoke detector, smoking near bedding or upholstery.   [x]    Sexuality: Discussed sexually transmitted diseases, partner selection, use of condoms, avoidance of unintended pregnancy  and contraceptive alternatives.  [x]    Dental health: Discussed importance of regular tooth  brushing, flossing, and dental visits.  [x]    Health maintenance and immunizations reviewed. Please refer to Health maintenance section.    - CBC with Differential/Platelet - Comprehensive metabolic panel - Lipid panel - TSH  2. GAD (generalized anxiety disorder) GAD7 score is mild, very well controlled on low dose prozac. (given by gyn) Continue meds, does not need refill. F/u in one year or as needed.   3. Encounter for hepatitis C screening test for low risk patient  - Hepatitis C antibody  4. Axillary lump, right -feels benign, but due to longevity, will check [place]  - [place] UPPER EXTREMITY DUPLEX RIGHT (NON-WBI); Future   Return in about 1 year (around 10/25/2021) for annual, TOC with dr. Jonni Sanger .     Orma Flaming, MD [place]  10/25/2020

## 2020-10-26 LAB — HEPATITIS C ANTIBODY
Hepatitis C Ab: NONREACTIVE
SIGNAL TO CUT-OFF: 0 (ref <1.00)

## 2020-11-07 ENCOUNTER — Other Ambulatory Visit: Payer: Self-pay | Admitting: Family Medicine

## 2020-11-07 DIAGNOSIS — R2231 Localized swelling, mass and lump, right upper limb: Secondary | ICD-10-CM

## 2020-11-07 LAB — HM MAMMOGRAPHY

## 2020-11-08 ENCOUNTER — Other Ambulatory Visit: Payer: Self-pay | Admitting: Family Medicine

## 2020-11-21 ENCOUNTER — Encounter: Payer: Self-pay | Admitting: Family Medicine

## 2021-09-27 IMAGING — US US EXTREM LOW*R* LIMITED
1 series · 10 of 10 positions shown · non-contrast
Comparison: None.

CLINICAL DATA: Posterior right thigh mass

EXAM:
ULTRASOUND RIGHT LOWER EXTREMITY LIMITED
TECHNIQUE: Ultrasound examination of the lower extremity soft tissues was
performed in the area of clinical concern.

[Series 1: us extrem low*right* limited · 0.06mm/px · 10 acquisitions, 10 frames shown]
[im 1/10]
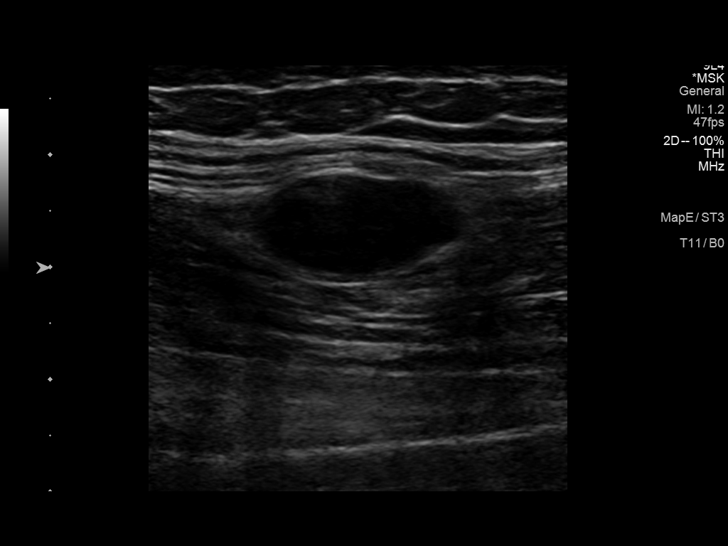
[im 2/10]
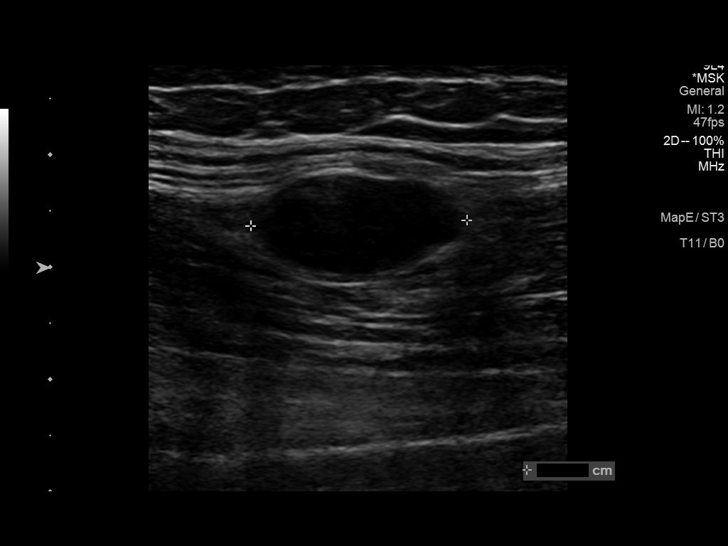
[im 3/10]
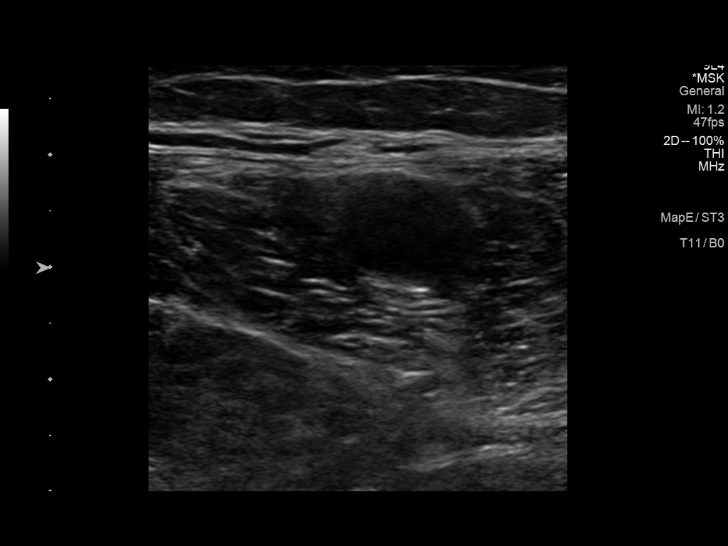
[im 4/10]
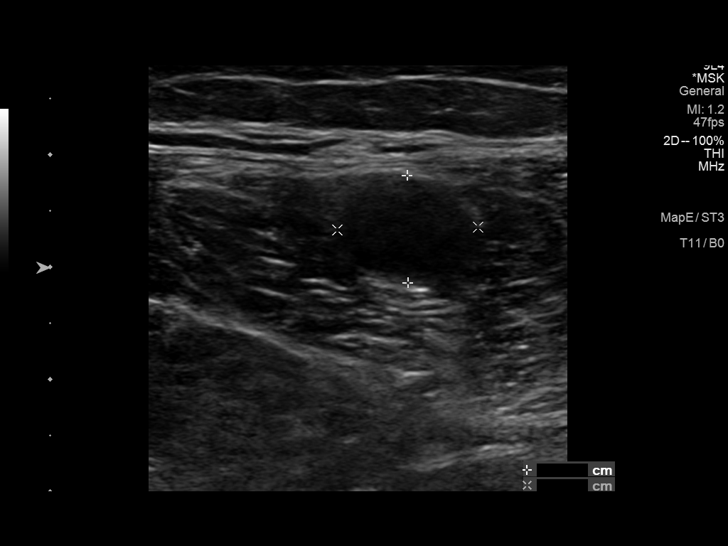
[im 5/10]
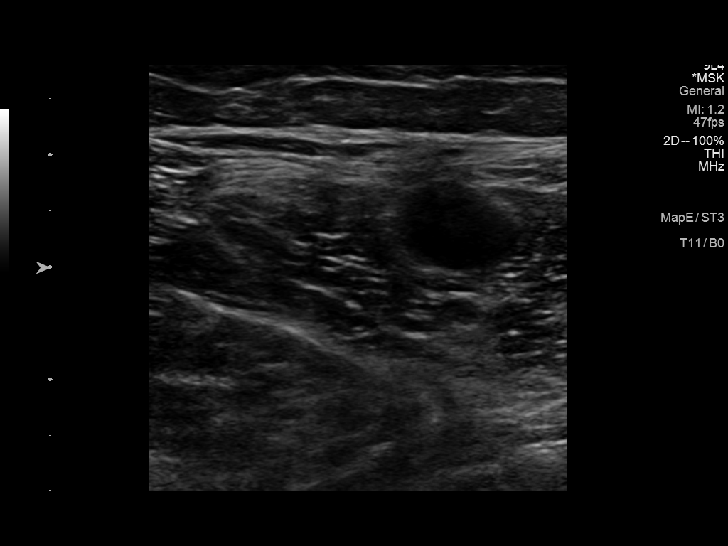
[im 6/10]
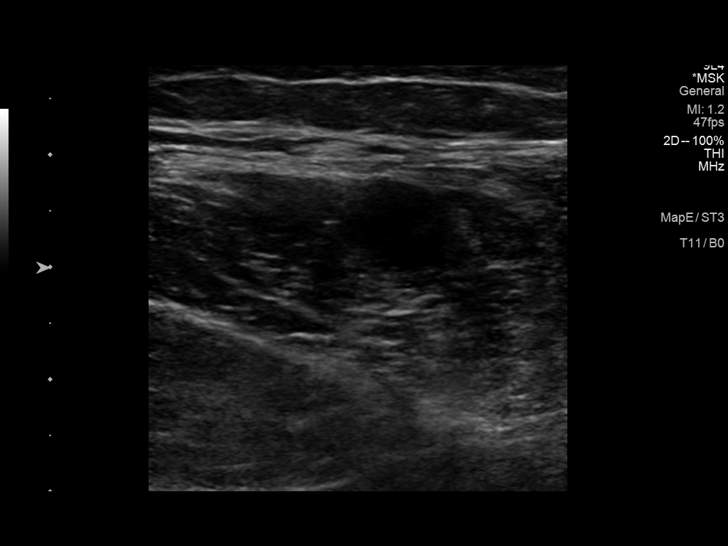
[im 7/10]
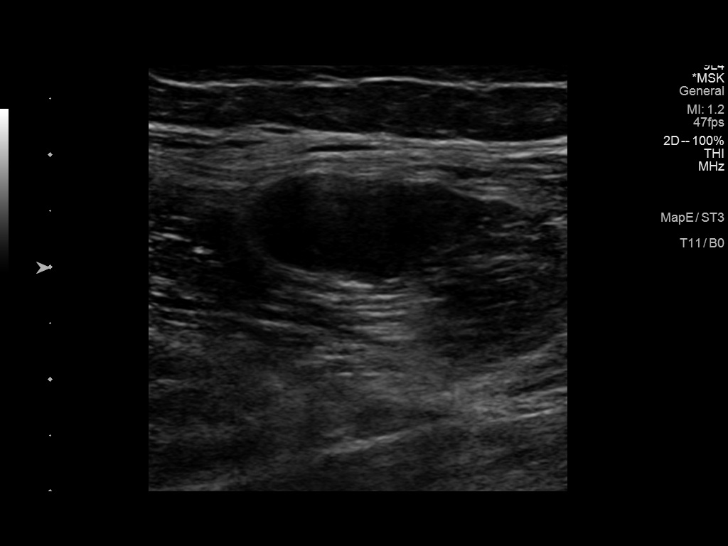
[im 8/10]
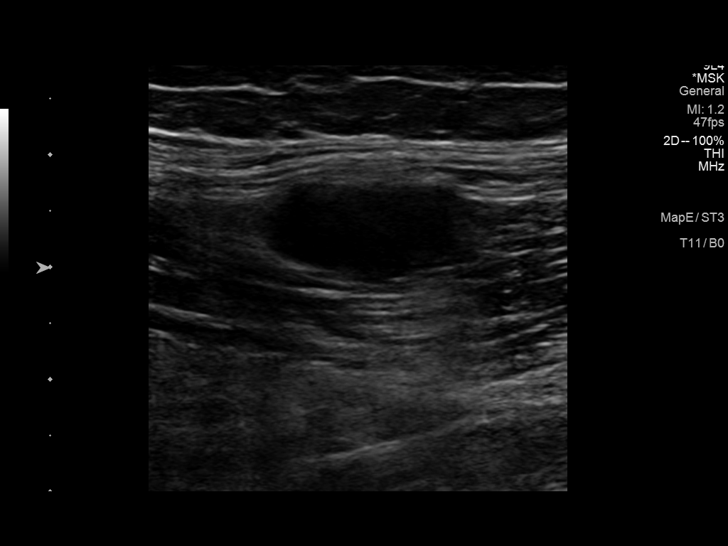
[im 9/10]
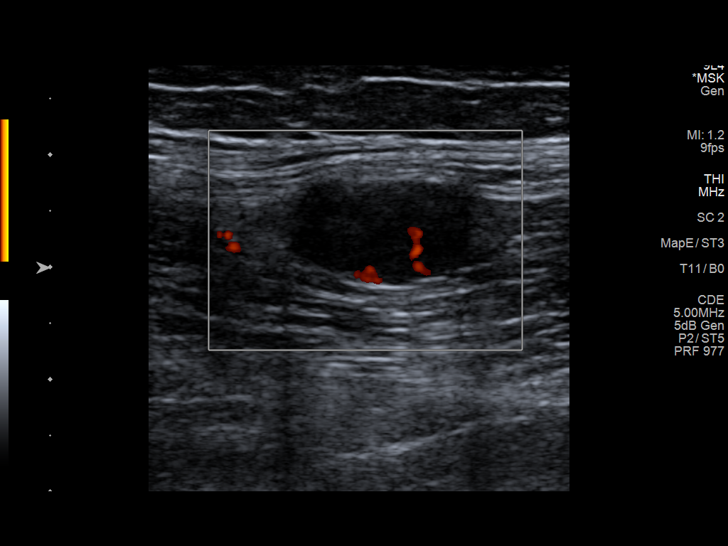
[im 10/10]
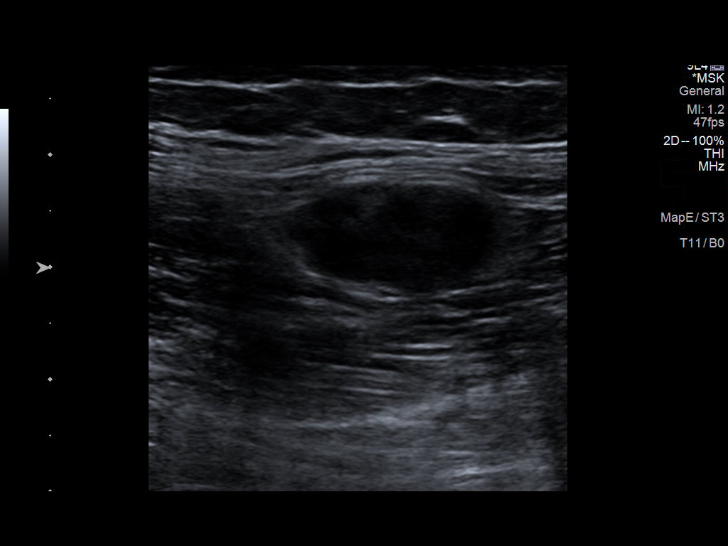

[10 of 10 positions shown; findings below may reference images not displayed]

FINDINGS: Targeted ultrasound was performed of the posterior right thigh at
site of patient's clinical concern. Within the deep soft tissues of
the right thigh is a circumscribed ovoid hypoechoic soft tissue mass
measuring 1.9 x 1.0 x 1.3 cm. Internal vascularity is present within
this mass on color Doppler.
IMPRESSION: Solid 1.9 cm soft tissue mass with internal vascularity within the
deep soft tissues of the posterior right thigh corresponding to
patient's palpable abnormality. Findings are atypical for a simple
lipoma and differential considerations include nerve sheath tumor
and soft tissue sarcoma. Further evaluation with contrast-enhanced
MRI of the right femur is recommended.

These results will be called to the ordering clinician or
representative by the Radiologist Assistant, and communication
documented in the PACS or [REDACTED].

## 2021-10-28 IMAGING — MR MR FEMUR*R* WO/W CM
9 series · 40 of 40 positions shown · IV contrast (multihance)
Comparison: Ultrasound 07/06/2019

CLINICAL DATA: Posterior right thigh mass for 5 years with
increasing pain

EXAM:
MRI OF THE RIGHT FEMUR WITHOUT AND WITH CONTRAST
TECHNIQUE: Multiplanar, multisequence MR imaging of the right femur was
performed both before and after administration of intravenous
contrast.
CONTRAST:  13mL MULTIHANCE GADOBENATE DIMEGLUMINE 529 MG/ML IV SOLN

[Series 4: T1 · coronal · right · 4.0mm · 0.96mm/px · 2 of 31 slices shown (1 of 2)]
[im 1/31]
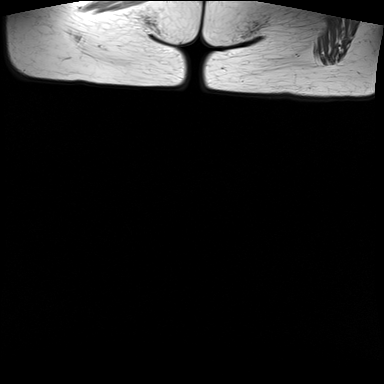
[im 31/31]
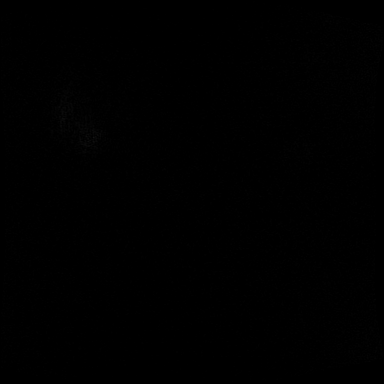

[Series 5: T2 fat-sat · coronal · right · 4.0mm · 0.96mm/px · 3 of 31 slices shown (1 of 2)]
[im 1/31]
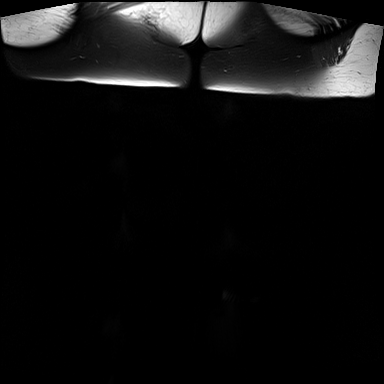
[im 16/31]
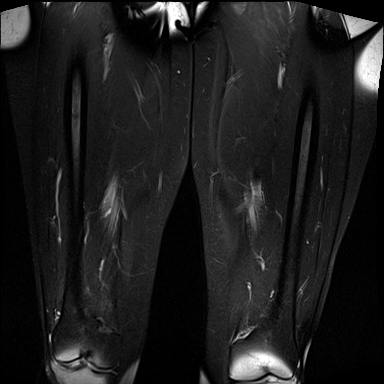
[im 31/31]
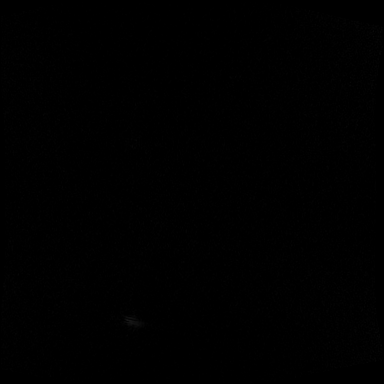

[Series 6: T1 · axial · right · 4.0mm · 0.62mm/px · z∈[-210,+54]mm · 6 of 56 slices shown (2 of 2)]
[im 1/56]
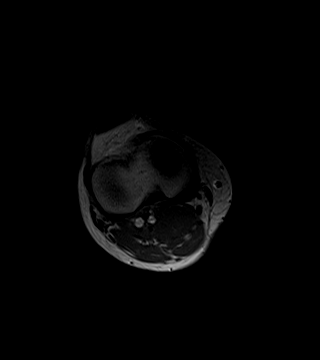
[im 12/56]
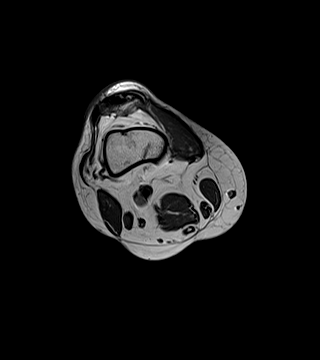
[im 23/56]
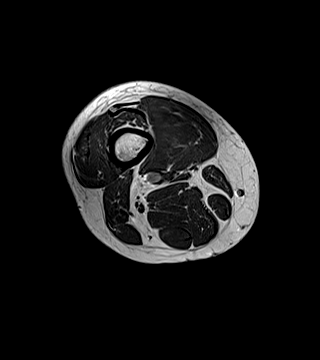
[im 34/56]
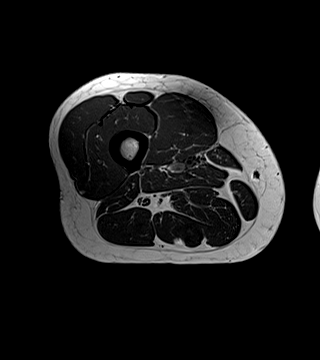
[im 45/56]
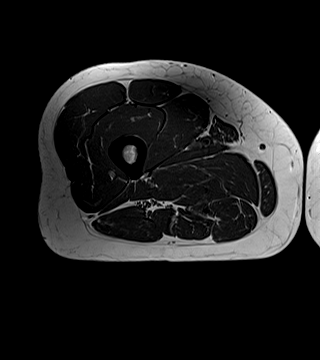
[im 56/56]
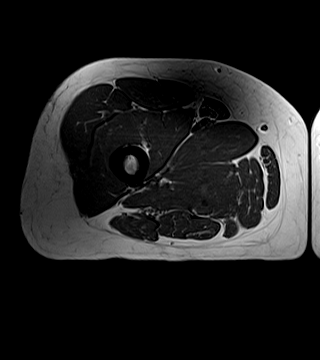

[Series 7: T2 fat-sat · axial · right · 4.0mm · 0.62mm/px · z∈[-210,+54]mm · 6 of 56 slices shown (2 of 2)]
[im 1/56]
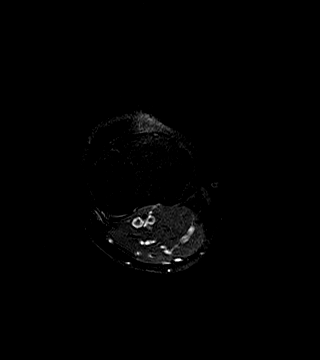
[im 12/56]
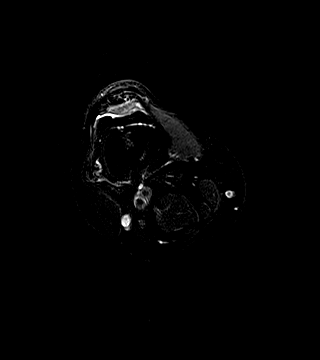
[im 23/56]
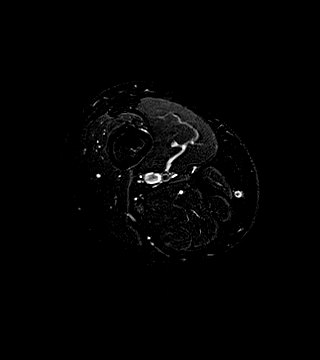
[im 34/56]
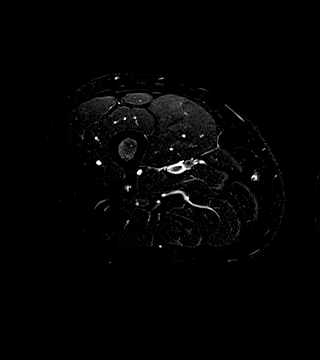
[im 45/56]
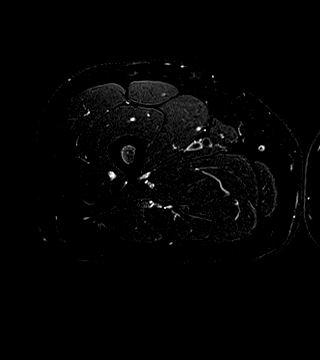
[im 56/56]
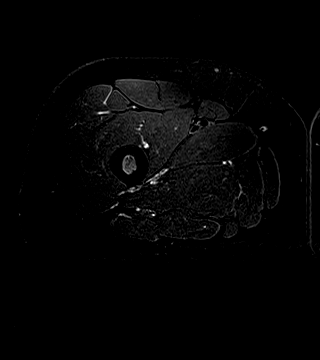

[Series 8: STIR · sagittal · right · 4.0mm · 1.17mm/px · 4 of 34 slices shown]
[im 1/34]
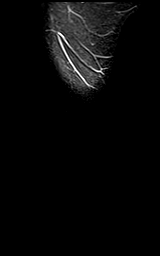
[im 12/34]
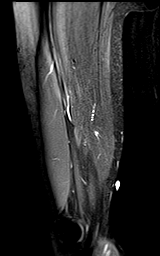
[im 23/34]
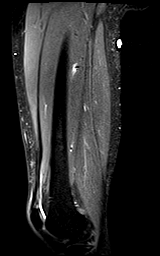
[im 34/34]
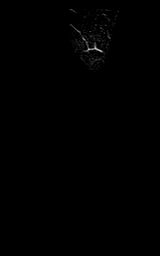

[Series 9: T1 fat-sat · axial · right · 4.0mm · 0.78mm/px · z∈[-210,+54]mm · 6 of 56 slices shown]
[im 1/56]
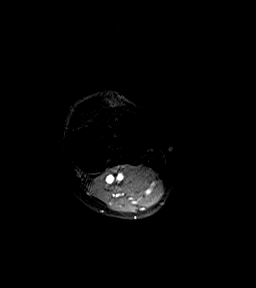
[im 12/56]
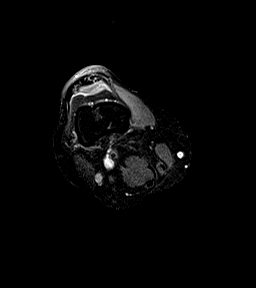
[im 23/56]
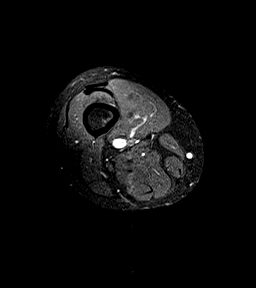
[im 34/56]
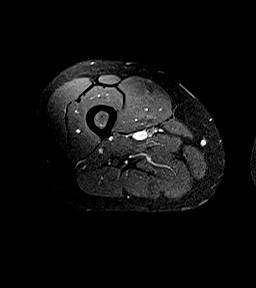
[im 45/56]
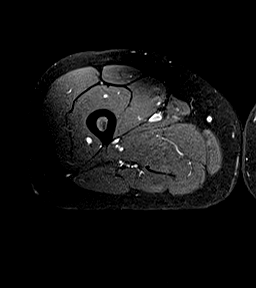
[im 56/56]
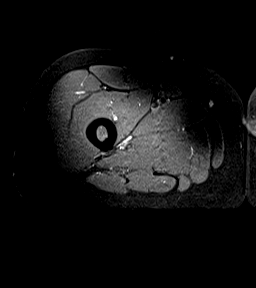

[Series 10: T1 fat-sat post-contrast · axial · right · 4.0mm · 0.78mm/px · z∈[-210,+54]mm · 6 of 56 slices shown (1 of 3)]
[im 1/56]
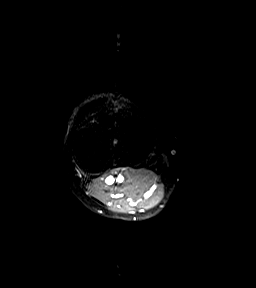
[im 12/56]
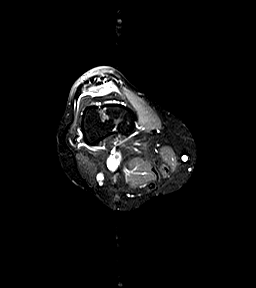
[im 23/56]
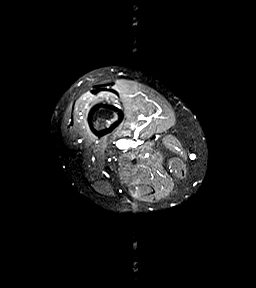
[im 34/56]
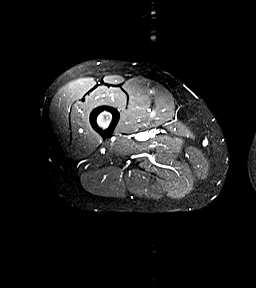
[im 45/56]
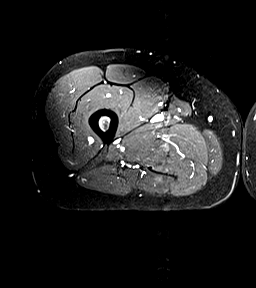
[im 56/56]
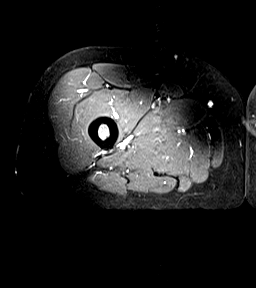

[Series 11: T1 fat-sat post-contrast · sagittal · right · 4.0mm · 1.17mm/px · 4 of 34 slices shown (2 of 3)]
[im 1/34]
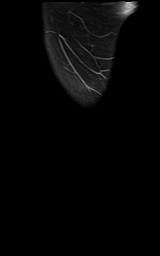
[im 12/34]
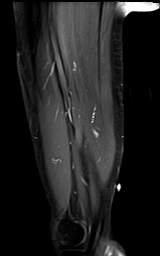
[im 23/34]
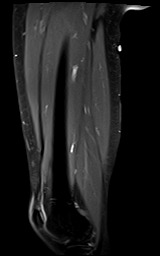
[im 34/34]
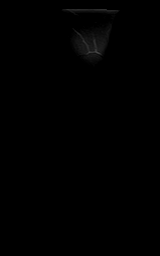

[Series 12: T1 fat-sat post-contrast · coronal · right · 4.0mm · 1.16mm/px · 3 of 31 slices shown (3 of 3)]
[im 1/31]
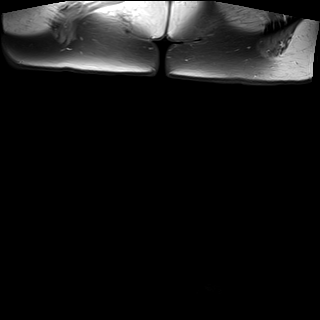
[im 16/31]
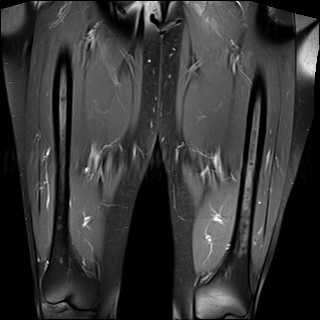
[im 31/31]
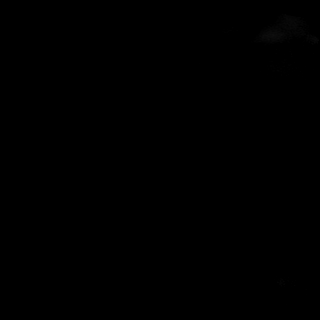

[40 of 40 positions shown; findings below may reference images not displayed]

FINDINGS: Bones/Joint/Cartilage

No acute fracture. No malalignment. No bone marrow edema. No
suspicious marrow replacing lesion. No significant arthropathy of
the right knee. No right knee joint effusion.

Ligaments

Grossly unremarkable.

Muscles and Tendons

Normal muscle bulk and signal intensity without edema, atrophy, or
fatty infiltration. Tendinous structures intact.

Soft tissues

Well-circumscribed ovoid T1 isointense, T2 homogeneously
hyperintense mass within the posterior compartment of the mid right
thigh abutting the superficial margin of the semitendinosus muscle
measuring 1.9 x 0.9 x 1.1 cm (series 7, image 20). Mass
homogeneously enhances following the administration of gadolinium
contrast. Split-fat appearance on coronal T1 weighted imaging
(series 4, images 25-26).

Additional bilobed T1 isointense, T2 hyperintense enhancing mass
posterior to the distal femoral metaphysis along the course of the
proximal common fibular nerve measuring 2.4 x 0.7 x 1.2 cm (series
7, image 44).

Remaining soft tissue structures of the right thigh are within
normal limits. No soft tissue edema or fluid collection.
IMPRESSION: 1. Well-circumscribed enhancing mass within the superficial aspect
of the posterior compartment of the mid right thigh adjacent to the
semitendinosus muscle measuring 1.9 x 0.9 x 1.1 cm. Imaging features
are most consistent with a peripheral nerve sheath tumor, likely
related to a muscular branch of the sciatic nerve.
2. Additional enhancing mass posterior to the distal femoral
metaphysis along the course of the proximal common fibular nerve
measuring 2.4 x 0.7 x 1.2 cm, also typical of a peripheral nerve
sheath tumor.
3. Remainder of the exam is within normal limits. No muscular
denervation changes.
